# Patient Record
Sex: Female | Born: 1955 | Race: White | Hispanic: No | Marital: Married | Smoking: Never smoker
Health system: Southern US, Community
[De-identification: ages and names within clinical notes are randomized; demographics above are authoritative.]

## PROBLEM LIST (undated history)

## (undated) DIAGNOSIS — E079 Disorder of thyroid, unspecified: Secondary | ICD-10-CM

## (undated) DIAGNOSIS — M1990 Unspecified osteoarthritis, unspecified site: Secondary | ICD-10-CM

## (undated) DIAGNOSIS — K219 Gastro-esophageal reflux disease without esophagitis: Secondary | ICD-10-CM

## (undated) DIAGNOSIS — U071 COVID-19: Secondary | ICD-10-CM

## (undated) DIAGNOSIS — Z5189 Encounter for other specified aftercare: Secondary | ICD-10-CM

## (undated) DIAGNOSIS — T7840XA Allergy, unspecified, initial encounter: Secondary | ICD-10-CM

## (undated) HISTORY — PX: ABDOMINAL HYSTERECTOMY: SHX81

## (undated) HISTORY — PX: CHOLECYSTECTOMY: SHX55

## (undated) HISTORY — PX: TONSILECTOMY/ADENOIDECTOMY WITH MYRINGOTOMY: SHX6125

## (undated) HISTORY — DX: Encounter for other specified aftercare: Z51.89

## (undated) HISTORY — PX: BACK SURGERY: SHX140

## (undated) HISTORY — DX: Allergy, unspecified, initial encounter: T78.40XA

## (undated) HISTORY — PX: KNEE SURGERY: SHX244

## (undated) HISTORY — DX: Gastro-esophageal reflux disease without esophagitis: K21.9

## (undated) HISTORY — DX: COVID-19: U07.1

---

## 2011-08-18 ENCOUNTER — Emergency Department
Admission: EM | Admit: 2011-08-18 | Discharge: 2011-08-18 | Disposition: A | Discharge status: Home / Self Care | Payer: 59 | Source: Home / Self Care | Attending: Emergency Medicine | Admitting: Emergency Medicine

## 2011-08-18 DIAGNOSIS — J069 Acute upper respiratory infection, unspecified: Secondary | ICD-10-CM

## 2011-08-18 DIAGNOSIS — J029 Acute pharyngitis, unspecified: Secondary | ICD-10-CM

## 2011-08-18 LAB — POCT RAPID STREP A (OFFICE): Rapid Strep A Screen: NEGATIVE

## 2011-08-18 MED ORDER — AMOXICILLIN 875 MG PO TABS: 875.0000 mg | ORAL_TABLET | Freq: Two times a day (BID) | ORAL | Status: AC

## 2011-08-18 NOTE — ED Provider Notes (Signed)
History     CSN: [id number]  Arrival date & time 08/18/11  1200   First MD Initiated Contact with Patient 08/18/11 1206      No chief complaint on file.   (Consider location/radiation/quality/duration/timing/severity/associated sxs/prior treatment) HPI Elna is a 56 y.o. female who complains of onset of cold symptoms for 7 days.  Off and on, but worse since yesterday, especially ST.  + sore throat No cough No pleuritic pain No wheezing + nasal congestion + post-nasal drainage + sinus pain/pressure No chest congestion No itchy/red eyes No earache No hemoptysis No SOB No chills/sweats No fever No nausea No vomiting No abdominal pain No diarrhea No skin rashes No fatigue No myalgias No headache    No past medical history on file.  No past surgical history on file.  No family history on file.  History  Substance Use Topics  . Smoking status: Not on file  . Smokeless tobacco: Not on file  . Alcohol Use: Not on file    OB History    No data available      Review of Systems  All other systems reviewed and are negative.    Allergies  Sulfa antibiotics  Home Medications  No current outpatient prescriptions on file.  BP 128/79  Pulse 90  Temp(Src) 98.8 F (37.1 C) (Oral)  Resp 18  Ht 5\' 8"  (1.727 m)  Wt 198 lb (89.812 kg)  BMI 30.11 kg/m2  SpO2 97%  Physical Exam  Nursing note and vitals reviewed. Constitutional: She is oriented to person, place, and time. She appears well-developed and well-nourished.  HENT:  Head: Normocephalic and atraumatic.  Right Ear: Tympanic membrane, external ear and ear canal normal.  Left Ear: Tympanic membrane, external ear and ear canal normal.  Nose: Mucosal edema and rhinorrhea present.  Mouth/Throat: Posterior oropharyngeal erythema present. No oropharyngeal exudate or posterior oropharyngeal edema.       There is a very small area on the top of her tongue consistent with thrush. Very mild.  Eyes: No scleral  icterus.  Neck: Neck supple.  Cardiovascular: Regular rhythm and normal heart sounds.   Pulmonary/Chest: Effort normal and breath sounds normal. No respiratory distress.  Neurological: She is alert and oriented to person, place, and time.  Skin: Skin is warm and dry.  Psychiatric: She has a normal mood and affect. Her speech is normal.    ED Course  Procedures (including critical care time)  Labs Reviewed - No data to display No results found.   1. Acute upper respiratory infections of unspecified site   2. Acute pharyngitis       MDM  1)  Take the prescribed antibiotic as instructed.  I advised her for a couple days since this is likely still viral. A rapid strep test is done and is negative. A throat culture is pending. I do not feel this thrush on her tongue needs treatment however if it's getting worse the patient will call us a call in nystatin suspension for this. I feel is most likely due to her recent viral syndrome. 2)  Use nasal saline solution (over the counter) at least 3 times a day. 3)  Use over the counter decongestants like Zyrtec-D every 12 hours as needed to help with congestion.  If you have hypertension, do not take medicines with sudafed.  4)  Can take tylenol every 6 hours or motrin every 8 hours for pain or fever. 5)  Follow up with your primary doctor if no improvement  in 5-7 days, sooner if increasing pain, fever, or new symptoms.     Lily Kocher, MD 08/18/11 [phone]

## 2011-08-19 LAB — STREP A DNA PROBE: GASP: NEGATIVE

## 2011-08-20 ENCOUNTER — Telehealth: Payer: Self-pay

## 2012-11-12 DIAGNOSIS — M3500 Sicca syndrome, unspecified: Secondary | ICD-10-CM

## 2012-11-12 DIAGNOSIS — M0600 Rheumatoid arthritis without rheumatoid factor, unspecified site: Secondary | ICD-10-CM

## 2012-11-12 HISTORY — DX: Sjogren syndrome, unspecified: M35.00

## 2012-11-12 HISTORY — DX: Rheumatoid arthritis without rheumatoid factor, unspecified site: M06.00

## 2016-11-23 ENCOUNTER — Emergency Department (HOSPITAL_COMMUNITY)
Admission: EM | Admit: 2016-11-23 | Discharge: 2016-11-23 | Disposition: A | Discharge status: Home / Self Care | Payer: Managed Care, Other (non HMO) | Attending: Emergency Medicine | Admitting: Emergency Medicine

## 2016-11-23 ENCOUNTER — Emergency Department (HOSPITAL_COMMUNITY): Payer: Managed Care, Other (non HMO)

## 2016-11-23 ENCOUNTER — Encounter (HOSPITAL_COMMUNITY): Payer: Self-pay | Admitting: Emergency Medicine

## 2016-11-23 DIAGNOSIS — R1011 Right upper quadrant pain: Secondary | ICD-10-CM | POA: Insufficient documentation

## 2016-11-23 DIAGNOSIS — K2970 Gastritis, unspecified, without bleeding: Secondary | ICD-10-CM

## 2016-11-23 DIAGNOSIS — K2971 Gastritis, unspecified, with bleeding: Secondary | ICD-10-CM | POA: Diagnosis not present

## 2016-11-23 DIAGNOSIS — R110 Nausea: Secondary | ICD-10-CM | POA: Diagnosis not present

## 2016-11-23 DIAGNOSIS — R072 Precordial pain: Secondary | ICD-10-CM | POA: Diagnosis not present

## 2016-11-23 DIAGNOSIS — Z79899 Other long term (current) drug therapy: Secondary | ICD-10-CM | POA: Insufficient documentation

## 2016-11-23 DIAGNOSIS — R197 Diarrhea, unspecified: Secondary | ICD-10-CM | POA: Diagnosis not present

## 2016-11-23 HISTORY — DX: Disorder of thyroid, unspecified: E07.9

## 2016-11-23 HISTORY — DX: Unspecified osteoarthritis, unspecified site: M19.90

## 2016-11-23 LAB — BASIC METABOLIC PANEL
ANION GAP: 10 (ref 5–15)
BUN: 11 mg/dL (ref 6–20)
CO2: 25 mmol/L (ref 22–32)
Calcium: 9.8 mg/dL (ref 8.9–10.3)
Chloride: 106 mmol/L (ref 101–111)
Creatinine, Ser: 0.96 mg/dL (ref 0.44–1.00)
GFR calc Af Amer: >60 mL/min (ref >60)
GLUCOSE: 106 mg/dL — AB (ref 65–99)
POTASSIUM: 3.7 mmol/L (ref 3.5–5.1)
Sodium: 141 mmol/L (ref 135–145)

## 2016-11-23 LAB — URINALYSIS, ROUTINE W REFLEX MICROSCOPIC
Bilirubin Urine: NEGATIVE
Glucose, UA: NEGATIVE mg/dL
Hgb urine dipstick: NEGATIVE
Ketones, ur: 5 mg/dL — AB
LEUKOCYTES UA: NEGATIVE
NITRITE: NEGATIVE
PH: 6 (ref 5.0–8.0)
Protein, ur: NEGATIVE mg/dL
Specific Gravity, Urine: 1.01 (ref 1.005–1.030)

## 2016-11-23 LAB — HEPATIC FUNCTION PANEL
ALBUMIN: 4.4 g/dL (ref 3.5–5.0)
ALT: 25 U/L (ref 14–54)
AST: 24 U/L (ref 15–41)
Alkaline Phosphatase: 46 U/L (ref 38–126)
BILIRUBIN DIRECT: 0.1 mg/dL (ref 0.1–0.5)
Indirect Bilirubin: 0.5 mg/dL (ref 0.3–0.9)
Total Bilirubin: 0.6 mg/dL (ref 0.3–1.2)
Total Protein: 7.5 g/dL (ref 6.5–8.1)

## 2016-11-23 LAB — CBC
HCT: 41.7 % (ref 36.0–46.0)
HEMOGLOBIN: 14.4 g/dL (ref 12.0–15.0)
MCH: 33.2 pg (ref 26.0–34.0)
MCHC: 34.5 g/dL (ref 30.0–36.0)
MCV: 96.1 fL (ref 78.0–100.0)
Platelets: 324 10*3/uL (ref 150–400)
RBC: 4.34 MIL/uL (ref 3.87–5.11)
RDW: 12.4 % (ref 11.5–15.5)
WBC: 7 10*3/uL (ref 4.0–10.5)

## 2016-11-23 LAB — LIPASE, BLOOD: Lipase: 23 U/L (ref 11–51)

## 2016-11-23 LAB — POCT I-STAT TROPONIN I: Troponin i, poc: 0 ng/mL (ref 0.00–0.08)

## 2016-11-23 LAB — I-STAT TROPONIN, ED: Troponin i, poc: 0 ng/mL (ref 0.00–0.08)

## 2016-11-23 MED ORDER — ONDANSETRON HCL 4 MG/2ML IJ SOLN
4.0000 mg | Freq: Once | INTRAMUSCULAR | Status: AC
  Administered 2016-11-23: 4 mg via INTRAVENOUS
  Filled 2016-11-23: qty 2

## 2016-11-23 MED ORDER — ONDANSETRON 4 MG PO TBDP: 4.0000 mg | ORAL_TABLET | Freq: Three times a day (TID) | ORAL | 0 refills | Status: DC | PRN

## 2016-11-23 MED ORDER — FAMOTIDINE IN NACL 20-0.9 MG/50ML-% IV SOLN
20.0000 mg | Freq: Once | INTRAVENOUS | Status: AC
  Administered 2016-11-23: 20 mg via INTRAVENOUS
  Filled 2016-11-23: qty 50

## 2016-11-23 MED ORDER — SODIUM CHLORIDE 0.9 % IV BOLUS (SEPSIS)
1000.0000 mL | Freq: Once | INTRAVENOUS | Status: AC
  Administered 2016-11-23: 1000 mL via INTRAVENOUS

## 2016-11-23 MED ORDER — GI COCKTAIL ~~LOC~~
30.0000 mL | Freq: Once | ORAL | Status: AC
  Administered 2016-11-23: 30 mL via ORAL
  Filled 2016-11-23: qty 30

## 2016-11-23 MED ORDER — RANITIDINE HCL 150 MG PO TABS: 150.0000 mg | ORAL_TABLET | Freq: Two times a day (BID) | ORAL | 0 refills | Status: DC

## 2016-11-23 NOTE — ED Triage Notes (Signed)
Patient c/o mid upper abd pain that radiates under right rib cage and at night to back x several days. Patient reports some nausea but dines vomiting.  Patient reports yesterday pressure was intermittent but today pain is constant. Also has some loose stools, with 2-3 in past 24 hours.

## 2016-11-23 NOTE — Discharge Instructions (Signed)
Your symptoms and pain are likely from gastritis or an ulcer. You will need to take zantac as directed, and avoid spicy/fatty/acidic foods, avoid soda/coffee/tea/alcohol. Avoid laying down flat within 30 minutes of eating. Avoid NSAIDs like ibuprofen/aleve/motrin/etc on an empty stomach. May consider using over the counter tums/maalox as needed for additional relief. Use zofran as directed as needed for nausea. Use tylenol as needed for pain. Follow the BRAT diet as outlined below, in order to help with diarrhea. May consider over the counter pepto bismol or imodium as needed for diarrhea. Call your regular doctor if bloody stools, persistent diarrhea, vomiting, fever or worsening abdominal pain. Follow up with your regular doctor in one week for recheck of symptoms and ongoing management of your symptoms. Return to the ER for changes or worsening symptoms.  Abdominal (belly) pain can be caused by many things. Your caregiver performed an examination and possibly ordered blood/urine tests and imaging (CT scan, x-rays, ultrasound). Many cases can be observed and treated at home after initial evaluation in the emergency department. Even though you are being discharged home, abdominal pain can be unpredictable. Therefore, you need a repeated exam if your pain does not resolve, returns, or worsens. Most patients with abdominal pain don't have to be admitted to the hospital or have surgery, but serious problems like appendicitis and gallbladder attacks can start out as nonspecific pain. Many abdominal conditions cannot be diagnosed in one visit, so follow-up evaluations are very important. SEEK IMMEDIATE MEDICAL ATTENTION IF YOU DEVELOP ANY OF THE FOLLOWING SYMPTOMS: The pain does not go away or becomes severe.  A temperature above 101 develops.  Repeated vomiting occurs (multiple episodes).  The pain becomes localized to portions of the abdomen. The right side could possibly be appendicitis. In an adult, the left  lower portion of the abdomen could be colitis or diverticulitis.  Blood is being passed in stools or vomit (bright red or black tarry stools).  Return also if you develop chest pain, difficulty breathing, dizziness or fainting, or become confused, poorly responsive, or inconsolable (young children). The constipation stays for more than 4 days.  There is belly (abdominal) or rectal pain.  You do not seem to be getting better.

## 2016-11-23 NOTE — ED Provider Notes (Signed)
[facility] DEPT Provider Note   CSN: [id number] Arrival date & time: 11/23/16  1159     History   Chief Complaint Chief Complaint  Patient presents with  . right rib pain    right  . mid upper abd pain    HPI Vanessa Velasquez is a 61 y.o. female with a PMHx of gastritis, hypothyroidism, and RA, with a PSHx of abd hysterectomy and cholecystectomy, who presents to the ED with complaints of intermittent right upper quadrant pain 3 days. Patient states that the symptoms worsen at night especially when she lays down, however have occasionally occurred during the day (as an example, yesterday when she was cleaning her house and wiping down her stainless steel furniture, when she bent over she began to have symptoms). She describes the pain is 6/10 intermittent RUQ achy and occasionally pressure-like pain that starts in the RUQ but then radiates to her upper back and across her LUQ and into the center of her chest. Symptoms worsen with laying flat, there is no change with inspiration or exertion, and they have been minimally improved with Tums and Tylenol. One time she had the pain radiates into her right jaw and neck. The symptoms have caused her to not be able to sleep very well. Her last episode of pain started at 10:30 AM today. She has had associated chills, nausea, and 3 episodes daily of looser than normal nonbloody stool beginning yesterday. She also reports that for the last 2 months she's had allergy symptoms including postnasal drip and cough with clear sputum production. She endorses very occasional alcohol use, had one glass of alcoholic beverage on Monday this week. States her symptoms feel similar to when she had gastritis 3 years ago and was seen in the ER at [facility]. +FHx of atherosclerosis in her MGM, no other FHx of CAD/MI/cardiac disease. Pt is nonsmoker. On hormone replacement therapy (estrogen and progesterone cream, this week is her "off week").   She denies diaphoresis,  lightheadedness, fevers, chills, hemoptysis, SOB, LE swelling, recent travel/surgery/immobilization, personal/family hx of DVT/PE, vomiting, constipation, obstipation, melena, hematochezia, hematuria, dysuria, myalgias, arthralgias, claudication, orthopnea, numbness, tingling, focal weakness, or any other complaints at this time. No known hx of HLD, HTN, DM2, or any other medical conditions aside from those listed below. Denies known hx of diverticulitis.    The history is provided by the patient and medical records. No language interpreter was used.  Abdominal Pain   This is a new problem. The current episode started more than 2 days ago. The problem occurs daily. The problem has not changed since onset.The pain is associated with an unknown factor. The pain is located in the RUQ. The quality of the pain is aching and pressure-like. The pain is at a severity of 6/10. The pain is moderate. Associated symptoms include diarrhea and nausea. Pertinent negatives include fever, flatus, hematochezia, melena, vomiting, constipation, dysuria, hematuria, arthralgias and myalgias. The symptoms are aggravated by certain positions. The symptoms are relieved by antacids and acetaminophen.    Past Medical History:  Diagnosis Date  . Arthritis   . Thyroid disease     There are no active problems to display for this patient.   Past Surgical History:  Procedure Laterality Date  . ABDOMINAL HYSTERECTOMY    . CHOLECYSTECTOMY    . KNEE SURGERY      OB History    No data available       Home Medications    Prior to Admission medications  Medication Sig Start Date End Date Taking? Authorizing Provider  progesterone 200 MG SUPP at bedtime.    [provider]  thyroid (ARMOUR) 120 MG tablet Take 120 mg by mouth daily.    [provider]    Family History No family history on file.  Social History Social History  Substance Use Topics  . Smoking status: Never Smoker  . Smokeless  tobacco: Never Used  . Alcohol use Yes     Comment: occasional      Allergies   Sulfa antibiotics   Review of Systems Review of Systems  Constitutional: Positive for chills. Negative for diaphoresis and fever.  HENT: Positive for postnasal drip.   Respiratory: Positive for cough. Negative for shortness of breath.   Cardiovascular: Positive for chest pain. Negative for leg swelling.  Gastrointestinal: Positive for abdominal pain, diarrhea and nausea. Negative for blood in stool, constipation, flatus, hematochezia, melena and vomiting.  Genitourinary: Negative for dysuria and hematuria.  Musculoskeletal: Negative for arthralgias and myalgias.  Skin: Negative for color change.  Allergic/Immunologic: Negative for immunocompromised state.  Neurological: Negative for weakness, light-headedness and numbness.  Psychiatric/Behavioral: Negative for confusion.   All other systems reviewed and are negative for acute change except as noted in the HPI.    Physical Exam Updated Vital Signs BP (!) 131/93 (BP Location: Right Arm)   Pulse 87   Temp 98.2 F (36.8 C) (Oral)   Resp 17   SpO2 100%   Physical Exam  Constitutional: She is oriented to person, place, and time. Vital signs are normal. She appears well-developed and well-nourished.  Non-toxic appearance. No distress.  Afebrile, nontoxic, NAD  HENT:  Head: Normocephalic and atraumatic.  Mouth/Throat: Oropharynx is clear and moist and mucous membranes are normal.  Eyes: Conjunctivae and EOM are normal. Right eye exhibits no discharge. Left eye exhibits no discharge.  Neck: Normal range of motion. Neck supple.  Cardiovascular: Normal rate, regular rhythm, normal heart sounds and intact distal pulses.  Exam reveals no gallop and no friction rub.   No murmur heard. RRR, nl s1/s2, no m/r/g, distal pulses intact, no pedal edema   Pulmonary/Chest: Effort normal and breath sounds normal. No respiratory distress. She has no decreased breath  sounds. She has no wheezes. She has no rhonchi. She has no rales.  CTAB in all lung fields, no w/r/r, no hypoxia or increased WOB, speaking in full sentences, SpO2 100% on RA   Abdominal: Soft. Normal appearance and bowel sounds are normal. She exhibits no distension. There is tenderness in the right upper quadrant and epigastric area. There is no rigidity, no rebound, no guarding, no CVA tenderness, no tenderness at McBurney's point and negative Murphy's sign.  Soft, nondistended, +BS throughout, with very mild RUQ/epigastric TTP, no r/g/r, neg murphy's, neg mcburney's, no CVA TTP   Musculoskeletal: Normal range of motion.  MAE x4 Strength and sensation grossly intact in all extremities Distal pulses intact No pedal edema, neg homan's bilaterally   Neurological: She is alert and oriented to person, place, and time. She has normal strength. No sensory deficit.  Skin: Skin is warm, dry and intact. No rash noted.  Psychiatric: She has a normal mood and affect.  Nursing note and vitals reviewed.    ED Treatments / Results  Labs (all labs ordered are listed, but only abnormal results are displayed) Labs Reviewed  BASIC METABOLIC PANEL - Abnormal; Notable for the following:       Result Value   Glucose, Bld 106 (*)  All other components within normal limits  URINALYSIS, ROUTINE W REFLEX MICROSCOPIC - Abnormal; Notable for the following:    Ketones, ur 5 (*)    All other components within normal limits  CBC  HEPATIC FUNCTION PANEL  LIPASE, BLOOD  I-STAT TROPOININ, ED  I-STAT TROPOININ, ED  POCT I-STAT TROPONIN I    EKG  EKG Interpretation  Date/Time:  Friday November 23 2016 12:12:19 EDT Ventricular Rate:  81 PR Interval:    QRS Duration: 80 QT Interval:  349 QTC Calculation: 406 R Axis:   6 Text Interpretation:  Sinus rhythm Low voltage, precordial leads No old tracing to compare Confirmed by Malvin Johns [phone]) on 11/23/2016 12:50:41 PM       Radiology Dg Chest 2  View  Result Date: 11/23/2016 CLINICAL DATA:  Right anterior chest pain for 2 days. EXAM: CHEST  2 VIEW COMPARISON:  None. FINDINGS: The lungs are clear. Heart size is normal. No pneumothorax or pleural effusion. No bony abnormality. IMPRESSION: Negative chest. Electronically Signed   By: Inge Rise M.D.   On: 11/23/2016 13:20    Procedures Procedures (including critical care time)  Medications Ordered in ED Medications  gi cocktail (Maalox,Lidocaine,Donnatal) (30 mLs Oral Given 11/23/16 1431)  famotidine (PEPCID) IVPB 20 mg premix (0 mg Intravenous Stopped 11/23/16 1554)  ondansetron (ZOFRAN) injection 4 mg (4 mg Intravenous Given 11/23/16 1431)  sodium chloride 0.9 % bolus 1,000 mL (0 mLs Intravenous Stopped 11/23/16 1554)     Initial Impression / Assessment and Plan / ED Course  I have reviewed the triage vital signs and the nursing notes.  Pertinent labs & imaging results that were available during my care of the patient were reviewed by me and considered in my medical decision making (see chart for details).     61 y.o. female here with RUQ/epigastric pain intermittent x3 days; feels similar to prior episode of gastritis. Some looser stools in the last 24hrs as well. Mild nausea and chills. On exam, mild RUQ and epigastric TTP, nonperitoneal, no murphy's or mcburney's tenderness. No flank tenderness. No tachycardia or hypoxia, no LE swelling, neg homan's. EKG nonischemic. Trop neg 2hrs after last episode of pain. CBC WNL, BMP WNL, CXR WNL. Will add on lipase, LFTs, U/A, but doubt need for further imaging; doubt CT abd/pelv would be helpful, as this seems mostly like gastritis/gastroenteritis. Doubt PE/dissection. Will try pepcid, zofran, fluids, GI cocktail and reassess, if persisting, may consider imaging; may repeat trop at 3hr from last trop. Discussed case with my attending Dr. Stark Jock who agrees with plan.   4:27 PM LFTs WNL. Lipase WNL. U/A unremarkable. Symptoms completely resolved  with meds, which makes gastritis/GERD even more likely. Second troponin done 3hrs after initial one, which is almost 6hrs after most recent pain episode, and this was negative. Overall, symptoms likely related to gastritis/GERD. Pt with low-risk HEART score, doubt need for further emergent work up or admission. Will start on zantac daily, advised use of tums/maalox as needed. Rx for zofran given. Discussed diet and lifestyle modifications for GERD/gastritis; advised tylenol for pain. BRAT diet advised, PRN pepto/imodium as needed for additional relief. F/up with PCP in 1wk for recheck of symptoms. I explained the diagnosis and have given explicit precautions to return to the ER including for any other new or worsening symptoms. The patient understands and accepts the medical plan as it's been dictated and I have answered their questions. Discharge instructions concerning home care and prescriptions have been given. The patient is  STABLE and is discharged to home in good condition.     Final Clinical Impressions(s) / ED Diagnoses   Final diagnoses:  Colicky RUQ abdominal pain  Precordial chest pain  Nausea  Gastritis, presence of bleeding unspecified, unspecified chronicity, unspecified gastritis type  Diarrhea, unspecified type    New Prescriptions New Prescriptions   ONDANSETRON (ZOFRAN ODT) 4 MG DISINTEGRATING TABLET    Take 1 tablet (4 mg total) by mouth every 8 (eight) hours as needed for nausea or vomiting.   RANITIDINE (ZANTAC) 150 MG TABLET    Take 1 tablet (150 mg total) by mouth 2 (two) times daily.     [address] [place], [place] 11/23/16 1628    Veryl Speak, MD 11/23/16 [phone]

## 2017-03-06 ENCOUNTER — Encounter: Payer: Self-pay | Admitting: Gastroenterology

## 2017-04-19 ENCOUNTER — Ambulatory Visit (INDEPENDENT_AMBULATORY_CARE_PROVIDER_SITE_OTHER): Payer: Managed Care, Other (non HMO) | Admitting: Gastroenterology

## 2017-04-19 ENCOUNTER — Encounter: Payer: Self-pay | Admitting: Gastroenterology

## 2017-04-19 VITALS — BP 122/74 | HR 68 | Ht 69.0 in | Wt 208.0 lb

## 2017-04-19 DIAGNOSIS — Z1211 Encounter for screening for malignant neoplasm of colon: Secondary | ICD-10-CM

## 2017-04-19 DIAGNOSIS — K219 Gastro-esophageal reflux disease without esophagitis: Secondary | ICD-10-CM

## 2017-04-19 MED ORDER — NA SULFATE-K SULFATE-MG SULF 17.5-3.13-1.6 GM/177ML PO SOLN: ORAL | 0 refills | Status: DC

## 2017-04-19 NOTE — Progress Notes (Signed)
HPI: This is a very pleasant 61 year old woman who was referred to me by Kerney Elbe, MD  to evaluate colon cancer screening, dyspepsia.    Chief complaint is routine risk for colon cancer, dyspepsia, indigestion  Colonoscopy 10 years ago in [place]; normal.  Also one 10 years prior to that and it was normal as well.  seroneg rhem arhtritis after joint pains Digesting issues when on prednisone chest pains and indigestion. This was 4-5 years ago.  Also pain in back of her neck with the indigestion.  She thinks she possibly was on NSAIDs around that same time  Dysphagia this past summer. Flu in the spring, coughing a lot.  Was put on zantac by ER after CP issue.  This helped the dysphagia issue very well. Still has pains in her neck.  Needs to take it bid.  Took alleve pretty regularly, then switched to tylenol.  When joint pains will take a single advil.  Overall her weight is stable.  If she skips zantac, she will have pyrosis, pains in neck in back.   Old Data Reviewed: Blood work July 2018 show normal complete metabolic profile, normal CBC.    Review of systems: Pertinent positive and negative review of systems were noted in the above HPI section. All other review negative.   Past Medical History:  Diagnosis Date  . Arthritis   . Thyroid disease     Past Surgical History:  Procedure Laterality Date  . ABDOMINAL HYSTERECTOMY    . CHOLECYSTECTOMY    . KNEE SURGERY    . TONSILECTOMY/ADENOIDECTOMY WITH MYRINGOTOMY      Current Outpatient Prescriptions  Medication Sig Dispense Refill  . acetaminophen (TYLENOL) 500 MG tablet Take 500-1,000 mg by mouth every 6 (six) hours as needed (For pain.).    Marland Kitchen Ascorbic Acid (VITAMIN C) 1000 MG tablet Take 1,000 mg by mouth daily.    . Carboxymethylcell-Hypromellose (GENTEAL OP) Place 2 drops into both eyes as needed (For dry eyes.).    Marland Kitchen Cholecalciferol (VITAMIN D3) 5000 units TABS Take 5,000 Units by mouth daily.     . Multiple  Vitamin (MULTIVITAMIN WITH MINERALS) TABS tablet Take 1 tablet by mouth daily.    Marland Kitchen omega-3 acid ethyl esters (LOVAZA) 1 g capsule Take 2 g by mouth 2 (two) times daily.    Marland Kitchen PRESCRIPTION MEDICATION Apply 100 mg topically See admin instructions. Progesterone 200mg /ml 20% Cream - applies at bedtime 3 weeks out of the month    . PRESCRIPTION MEDICATION Apply 1 mg topically See admin instructions. Testosterone 2mg /ml Cream - applies daily 3 weeks out of the month    . PRESCRIPTION MEDICATION Apply 0.75 mLs topically See admin instructions. Bi-Est 50:50 5mg /ml Cream - applies twice daily for 3 weeks out of the month    . ranitidine (ZANTAC) 150 MG tablet Take 1 tablet (150 mg total) by mouth 2 (two) times daily. 60 tablet 0  . thyroid (NP THYROID) 120 MG tablet Take 120 mg by mouth daily before breakfast.     No current facility-administered medications for this visit.     Allergies as of 04/19/2017 - Review Complete 04/19/2017  Allergen Reaction Noted  . Sulfa antibiotics Swelling and Rash 08/18/2011    Family History  Problem Relation Age of Onset  . Thyroid disease Mother   . Breast cancer Sister   . Diabetes Sister     Social History   Social History  . Marital status: Married    Spouse name: N/A  .  Number of children: 4  . Years of education: N/A   Occupational History  . Not on file.   Social History Main Topics  . Smoking status: Never Smoker  . Smokeless tobacco: Never Used  . Alcohol use Yes     Comment: occasional   . Drug use: No  . Sexual activity: Not on file   Other Topics Concern  . Not on file   Social History Narrative  . No narrative on file     Physical Exam: BP 122/74   Pulse 68   Ht 5\' 9"  (1.753 m)   Wt 208 lb (94.3 kg)   BMI 30.72 kg/m  Constitutional: generally well-appearing Psychiatric: alert and oriented x3 Eyes: extraocular movements intact Mouth: oral pharynx moist, no lesions Neck: supple no lymphadenopathy Cardiovascular: heart  regular rate and rhythm Lungs: clear to auscultation bilaterally Abdomen: soft, nontender, nondistended, no obvious ascites, no peritoneal signs, normal bowel sounds Extremities: no lower extremity edema bilaterally Skin: no lesions on visible extremities   Assessment and plan: 61 y.o. female with routine risk for colon cancer, chronic GERD, dyspepsia  First she is at routine risk for colon cancer and her last screening was 10 years ago by colonoscopy.  We will plan for repeat screening colonoscopy at her soonest convenience.  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  Ranitidine 150 mg twice daily seems to work very well for her but when she skips the medicine she has some pyrosis as well as some posterior high neck discomfort.  I explained that the neck discomfort is an unusual symptom for GERD but since it is so reliable that it occurs when she has pyrosis and when she skips [id number], it certainly may be a GERD symptom.  She has had dysphasia in the past.  I recommended she stay on her twice daily H2 blockers and at the same time as her colonoscopy we will plan on upper endoscopy for her as well to check for significant GERD damage, Barrett's esophagus.    Please see the "Patient Instructions" section for addition details about the plan.   Owens Loffler, MD [facility] 04/19/2017, 8:42 AM  Cc: Kerney Elbe, MD

## 2017-04-19 NOTE — Addendum Note (Signed)
Addended by: Candie Mile on: 04/19/2017 09:53 AM   Modules accepted: Orders

## 2017-04-19 NOTE — Patient Instructions (Addendum)
You will be set up for a colonoscopy for screening. You will be set up for an upper endoscopy at the same time for dypsepsia, GERD.  You have been scheduled for an endoscopy and colonoscopy. Please follow the written instructions given to you at your visit today. Please pick up your prep supplies at the pharmacy within the next 1-3 days. If you use inhalers (even only as needed), please bring them with you on the day of your procedure. Your physician has requested that you go to www.startemmi.com and enter the access code given to you at your visit today. This web site gives a general overview about your procedure. However, you should still follow specific instructions given to you by our office regarding your preparation for the procedure.  Stay on ranitidine 150 twice daily. Change the evening pill so it is at bedtime.

## 2017-05-06 ENCOUNTER — Encounter: Payer: Self-pay | Admitting: Gastroenterology

## 2017-05-10 ENCOUNTER — Ambulatory Visit (AMBULATORY_SURGERY_CENTER): Payer: Managed Care, Other (non HMO) | Admitting: Gastroenterology

## 2017-05-10 ENCOUNTER — Encounter: Payer: Self-pay | Admitting: Gastroenterology

## 2017-05-10 ENCOUNTER — Encounter: Payer: 59 | Admitting: Gastroenterology

## 2017-05-10 VITALS — BP 106/58 | HR 58 | Temp 98.0°F | Resp 15 | Ht 69.0 in | Wt 208.0 lb

## 2017-05-10 DIAGNOSIS — Z1211 Encounter for screening for malignant neoplasm of colon: Secondary | ICD-10-CM

## 2017-05-10 DIAGNOSIS — K219 Gastro-esophageal reflux disease without esophagitis: Secondary | ICD-10-CM

## 2017-05-10 DIAGNOSIS — K209 Esophagitis, unspecified without bleeding: Secondary | ICD-10-CM

## 2017-05-10 DIAGNOSIS — K5730 Diverticulosis of large intestine without perforation or abscess without bleeding: Secondary | ICD-10-CM

## 2017-05-10 DIAGNOSIS — D124 Benign neoplasm of descending colon: Secondary | ICD-10-CM | POA: Diagnosis not present

## 2017-05-10 MED ORDER — SODIUM CHLORIDE 0.9 % IV SOLN: 500.0000 mL | INTRAVENOUS | Status: DC

## 2017-05-10 MED ORDER — OMEPRAZOLE 40 MG PO CPDR: 40.0000 mg | DELAYED_RELEASE_CAPSULE | Freq: Every day | ORAL | 11 refills | Status: DC

## 2017-05-10 NOTE — Patient Instructions (Signed)
Impression/Recommendations:  GERD handout given to patient. Hiatal hernia handout given to patient. Polyp handout given to patient. Diverticulosis handout given to patient.  Resume previous diet. Stop Zantac, and being Omeprazole 40 mg. Pill each morning shortly before breakfast daily.  Call office in 2-3 months to reports on symptoms.  Repeat colonoscopy recommended based on pathology results.  YOU HAD AN ENDOSCOPIC PROCEDURE TODAY AT [facility]:   Refer to the procedure report that was given to you for any specific questions about what was found during the examination.  If the procedure report does not answer your questions, please call your gastroenterologist to clarify.  If you requested that your care partner not be given the details of your procedure findings, then the procedure report has been included in a sealed envelope for you to review at your convenience later.  YOU SHOULD EXPECT: Some feelings of bloating in the abdomen. Passage of more gas than usual.  Walking can help get rid of the air that was put into your GI tract during the procedure and reduce the bloating. If you had a lower endoscopy (such as a colonoscopy or flexible sigmoidoscopy) you may notice spotting of blood in your stool or on the toilet paper. If you underwent a bowel prep for your procedure, you may not have a normal bowel movement for a few days.  Please Note:  You might notice some irritation and congestion in your nose or some drainage.  This is from the oxygen used during your procedure.  There is no need for concern and it should clear up in a day or so.  SYMPTOMS TO REPORT IMMEDIATELY:   Following lower endoscopy (colonoscopy or flexible sigmoidoscopy):  Excessive amounts of blood in the stool  Significant tenderness or worsening of abdominal pains  Swelling of the abdomen that is new, acute  Fever of 100F or higher   Following upper endoscopy (EGD)  Vomiting of blood or coffee  ground material  New chest pain or pain under the shoulder blades  Painful or persistently difficult swallowing  New shortness of breath  Fever of 100F or higher  Black, tarry-looking stools  For urgent or emergent issues, a gastroenterologist can be reached at any hour by calling [phone].   DIET:  We do recommend a small meal at first, but then you may proceed to your regular diet.  Drink plenty of fluids but you should avoid alcoholic beverages for 24 hours.  ACTIVITY:  You should plan to take it easy for the rest of today and you should NOT DRIVE or use heavy machinery until tomorrow (because of the sedation medicines used during the test).    FOLLOW UP: Our staff will call the number listed on your records the next business day following your procedure to check on you and address any questions or concerns that you may have regarding the information given to you following your procedure. If we do not reach you, we will leave a message.  However, if you are feeling well and you are not experiencing any problems, there is no need to return our call.  We will assume that you have returned to your regular daily activities without incident.  If any biopsies were taken you will be contacted by phone or by letter within the next 1-3 weeks.  Please call us at [phone] if you have not heard about the biopsies in 3 weeks.    SIGNATURES/CONFIDENTIALITY: You and/or your care partner have signed paperwork  which will be entered into your electronic medical record.  These signatures attest to the fact that that the information above on your After Visit Summary has been reviewed and is understood.  Full responsibility of the confidentiality of this discharge information lies with you and/or your care-partner.

## 2017-05-10 NOTE — Op Note (Signed)
[facility] Patient Name: Vanessa Velasquez Procedure Date: 05/10/2017 8:05 AM MRN: [id number] Endoscopist: Milus Banister , MD Age: 61 Referring MD:  Date of Birth: 02-Jul-1955 Gender: Female Account #: [id number] Procedure:                Colonoscopy Indications:              Screening for colorectal malignant neoplasm;                            colonoscopy 10 years ago (elsewhere) was normal Medicines:                Monitored Anesthesia Care Procedure:                Pre-Anesthesia Assessment:                           - Prior to the procedure, a History and Physical                            was performed, and patient medications and                            allergies were reviewed. The patient's tolerance of                            previous anesthesia was also reviewed. The risks                            and benefits of the procedure and the sedation                            options and risks were discussed with the patient.                            All questions were answered, and informed consent                            was obtained. Prior Anticoagulants: The patient has                            taken no previous anticoagulant or antiplatelet                            agents. ASA Grade Assessment: II - A patient with                            mild systemic disease. After reviewing the risks                            and benefits, the patient was deemed in                            satisfactory condition to undergo the procedure.  After obtaining informed consent, the colonoscope                            was passed under direct vision. Throughout the                            procedure, the patient's blood pressure, pulse, and                            oxygen saturations were monitored continuously. The                            Colonoscope was introduced through the anus and                            advanced to the  the cecum, identified by                            appendiceal orifice and ileocecal valve. The                            colonoscopy was performed without difficulty. The                            patient tolerated the procedure well. The quality                            of the bowel preparation was good. The ileocecal                            valve, appendiceal orifice, and rectum were                            photographed. Scope In: 8:17:41 AM Scope Out: 8:33:44 AM Scope Withdrawal Time: 0 hours 11 minutes 32 seconds  Total Procedure Duration: 0 hours 16 minutes 3 seconds  Findings:                 A 8 mm polyp was found in the descending colon. The                            polyp was sessile. The polyp was removed with a                            cold snare. Resection and retrieval were complete.                           Multiple small and large-mouthed diverticula were                            found in the left colon.                           The exam was otherwise without abnormality on  direct and retroflexion views. Complications:            No immediate complications. Estimated blood loss:                            None. Estimated Blood Loss:     Estimated blood loss: none. Impression:               - One 8 mm polyp in the descending colon, removed                            with a cold snare. Resected and retrieved.                           - Diverticulosis in the left colon.                           - The examination was otherwise normal on direct                            and retroflexion views. Recommendation:           - Patient has a contact number available for                            emergencies. The signs and symptoms of potential                            delayed complications were discussed with the                            patient. Return to normal activities tomorrow.                            Written discharge  instructions were provided to the                            patient.                           - Resume previous diet.                           - Continue present medications.                           You will receive a letter within 2-3 weeks with the                            pathology results and my final recommendations.                           If the polyp(s) is proven to be 'pre-cancerous' on                            pathology, you will need repeat colonoscopy in 5  years. If the polyp(s) is NOT 'precancerous' on                            pathology then you should repeat colon cancer                            screening in 10 years with colonoscopy without need                            for colon cancer screening by any method prior to                            then (including stool testing). Milus Banister, MD 05/10/2017 8:35:55 AM This report has been signed electronically.

## 2017-05-10 NOTE — Progress Notes (Signed)
To PACU, VSS. Report to [email or web address] 

## 2017-05-10 NOTE — Progress Notes (Signed)
Called to room to assist during endoscopic procedure.  Patient ID and intended procedure confirmed with present staff. Received instructions for my participation in the procedure from the performing physician.  

## 2017-05-10 NOTE — Op Note (Signed)
[facility] Patient Name: Vanessa Velasquez Procedure Date: 05/10/2017 8:05 AM MRN: [id number] Endoscopist: Milus Banister , MD Age: 61 Referring MD:  Date of Birth: 1956-01-21 Gender: Female Account #: [id number] Procedure:                Upper GI endoscopy Indications:              Dysphagia, Heartburn Medicines:                Monitored Anesthesia Care Procedure:                Pre-Anesthesia Assessment:                           - Prior to the procedure, a History and Physical                            was performed, and patient medications and                            allergies were reviewed. The patient's tolerance of                            previous anesthesia was also reviewed. The risks                            and benefits of the procedure and the sedation                            options and risks were discussed with the patient.                            All questions were answered, and informed consent                            was obtained. Prior Anticoagulants: The patient has                            taken no previous anticoagulant or antiplatelet                            agents. ASA Grade Assessment: II - A patient with                            mild systemic disease. After reviewing the risks                            and benefits, the patient was deemed in                            satisfactory condition to undergo the procedure.                           After obtaining informed consent, the endoscope was  passed under direct vision. Throughout the                            procedure, the patient's blood pressure, pulse, and                            oxygen saturations were monitored continuously. The                            Endoscope was introduced through the mouth, and                            advanced to the second part of duodenum. The upper                            GI endoscopy was accomplished  without difficulty.                            The patient tolerated the procedure well. Scope In: Scope Out: Findings:                 LA Grade C (one or more mucosal breaks continuous                            between tops of 2 or more mucosal folds, less than                            75% circumference) esophagitis was found in the                            lower third of the esophagus. There was a minor                            associated peptic stricture that I did not dilate                            given the ulcerative esophagitis and lack of                            dysphagia while on anti acid medicine.                           A small hiatal hernia was present.                           The examined duodenum was normal. Complications:            No immediate complications. Estimated blood loss:                            None. Estimated Blood Loss:     Estimated blood loss: none. Impression:               - Moderate, reflux related, ulcerative esophagitis  and focal peptic stricture.                           - Small hiatal hernia.                           - Normal examined duodenum.                           - No specimens collected. Recommendation:           - Patient has a contact number available for                            emergencies. The signs and symptoms of potential                            delayed complications were discussed with the                            patient. Return to normal activities tomorrow.                            Written discharge instructions were provided to the                            patient.                           - Resume previous diet.                           - Stop zantac and instead start omeprazole 40mg                             pill, one pill every morning shortly before                            breakfast meal daily, disp 30 with 11 refils. Call                            my office  in 2-3 months to report on your symptoms. Milus Banister, MD 05/10/2017 8:44:37 AM This report has been signed electronically.

## 2017-05-13 ENCOUNTER — Telehealth: Payer: Self-pay | Admitting: *Deleted

## 2017-05-13 NOTE — Telephone Encounter (Signed)
No answer, message left for the patient. 

## 2017-05-15 ENCOUNTER — Encounter: Payer: Self-pay | Admitting: Gastroenterology

## 2018-05-07 IMAGING — CR DG CHEST 2V
2 series · 2 of 2 positions shown · non-contrast
Comparison: None.

CLINICAL DATA: Right anterior chest pain for 2 days.

EXAM:
CHEST  2 VIEW

[w chest pa]
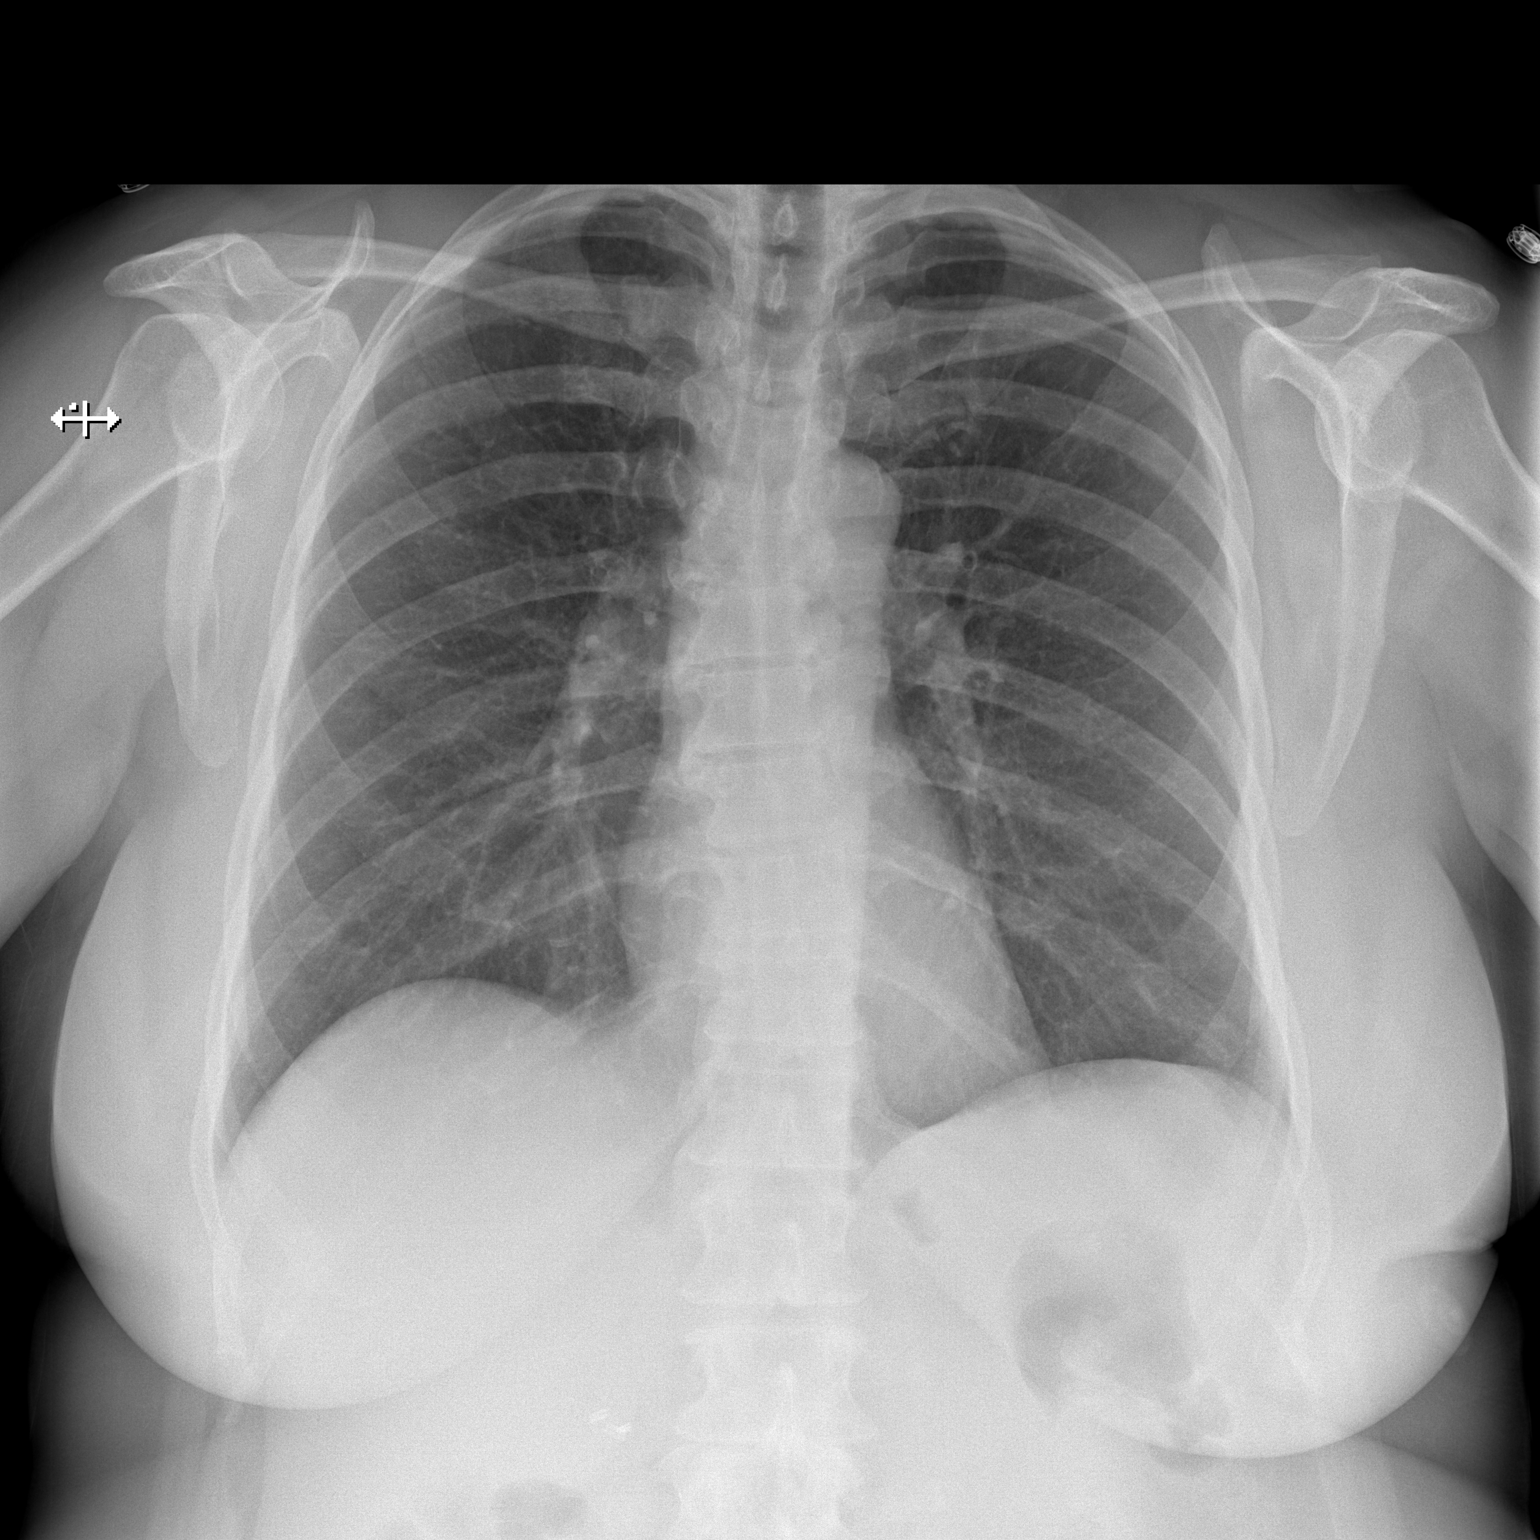

[w chest lat]
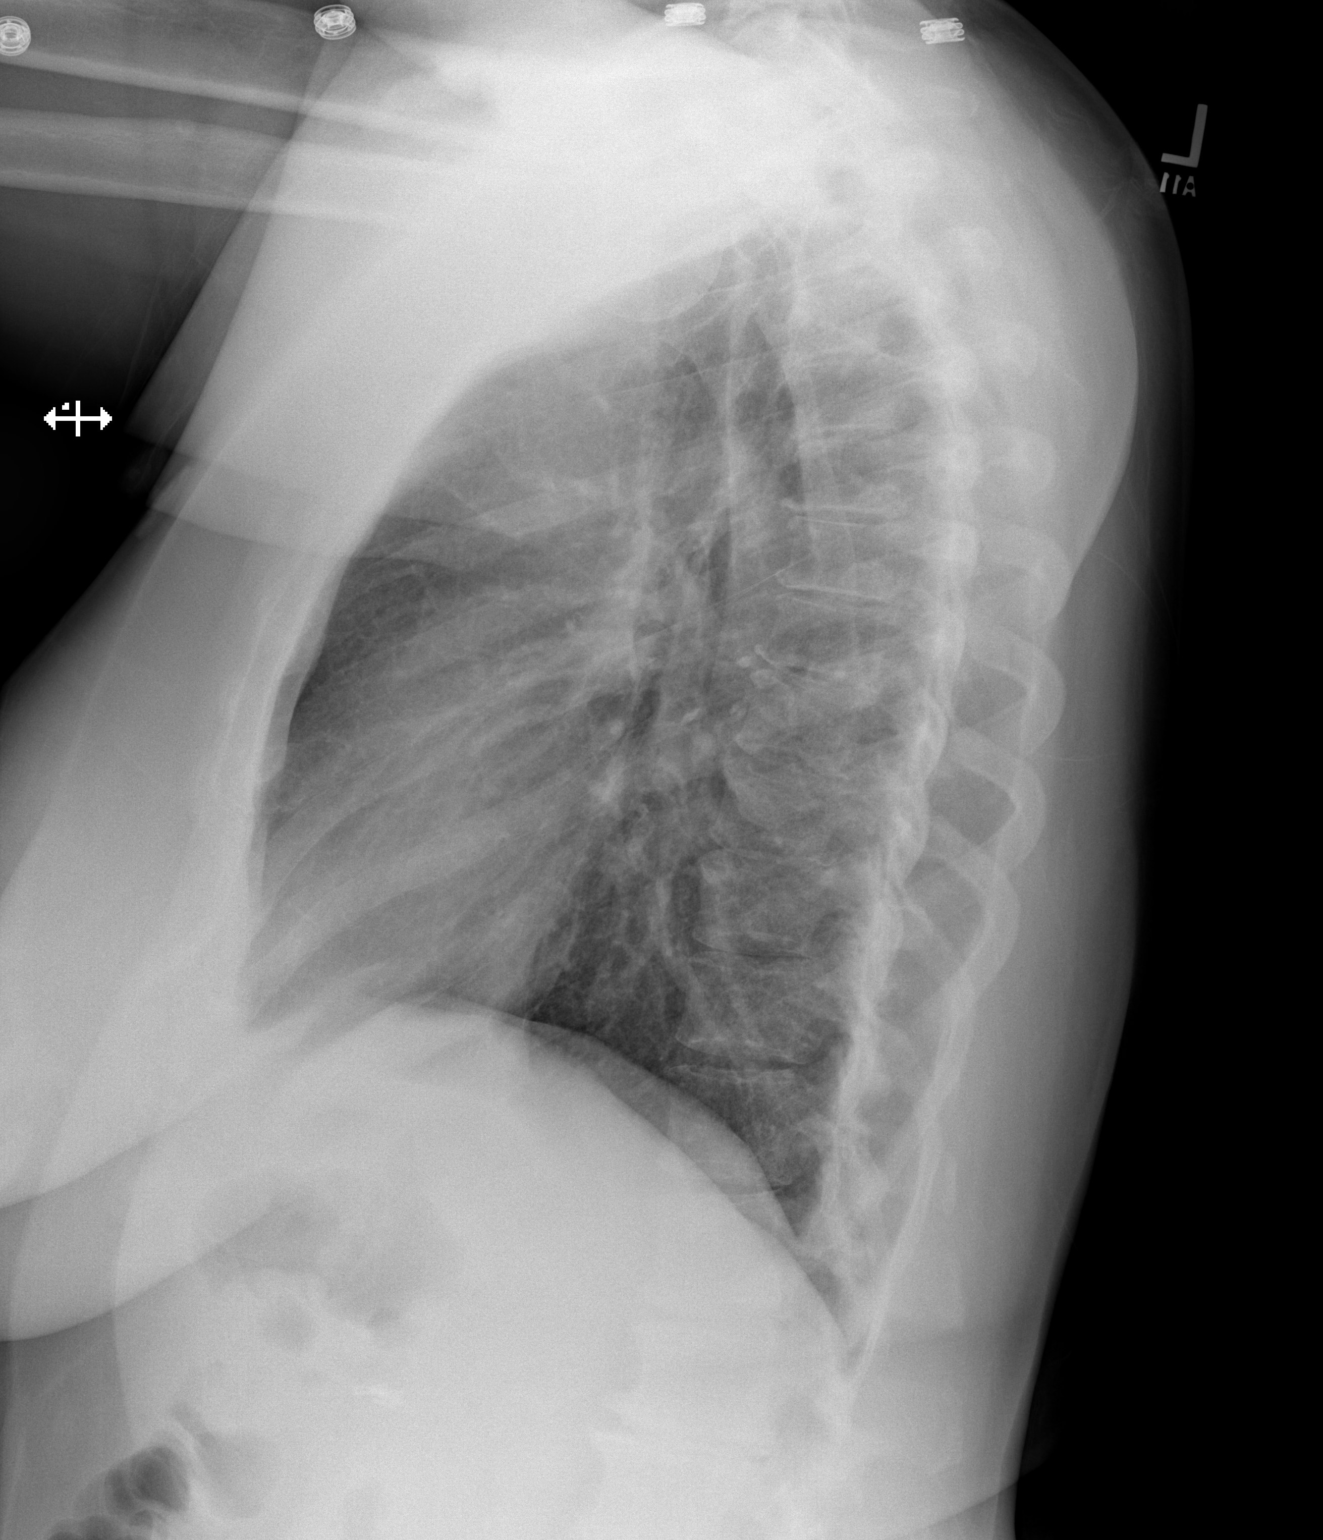

[2 of 2 positions shown; findings below may reference images not displayed]

FINDINGS: The lungs are clear. Heart size is normal. No pneumothorax or
pleural effusion. No bony abnormality.
IMPRESSION: Negative chest.

## 2018-05-22 ENCOUNTER — Other Ambulatory Visit: Payer: Self-pay | Admitting: Gastroenterology

## 2019-05-20 DIAGNOSIS — M48062 Spinal stenosis, lumbar region with neurogenic claudication: Secondary | ICD-10-CM

## 2019-05-20 DIAGNOSIS — M4316 Spondylolisthesis, lumbar region: Secondary | ICD-10-CM

## 2019-05-20 HISTORY — DX: Spondylolisthesis, lumbar region: M43.16

## 2019-05-20 HISTORY — DX: Spinal stenosis, lumbar region with neurogenic claudication: M48.062

## 2019-09-23 DIAGNOSIS — R1011 Right upper quadrant pain: Secondary | ICD-10-CM

## 2019-09-23 DIAGNOSIS — R1013 Epigastric pain: Secondary | ICD-10-CM

## 2019-09-23 HISTORY — DX: Right upper quadrant pain: R10.11

## 2019-09-23 HISTORY — DX: Epigastric pain: R10.13

## 2019-10-02 ENCOUNTER — Other Ambulatory Visit: Payer: Self-pay

## 2019-10-02 ENCOUNTER — Encounter: Payer: Self-pay | Admitting: Gastroenterology

## 2019-10-02 ENCOUNTER — Ambulatory Visit (INDEPENDENT_AMBULATORY_CARE_PROVIDER_SITE_OTHER): Payer: BC Managed Care – PPO | Admitting: Gastroenterology

## 2019-10-02 DIAGNOSIS — R109 Unspecified abdominal pain: Secondary | ICD-10-CM

## 2019-10-02 DIAGNOSIS — R140 Abdominal distension (gaseous): Secondary | ICD-10-CM

## 2019-10-02 NOTE — Progress Notes (Signed)
Review of pertinent gastrointestinal problems: 1.  GERD, dysphagia.  EGD November 2018 Dr. Ardis Hughs found severe, LA grade C, reflux related esophagitis and a small hiatal hernia.  I changed her from H2 blocker to omeprazole 40 mg once daily.  She was to call back in 2 to 3 months to report her symptoms.  We have not heard from her since 2.  Colonoscopy, November 2018 for routine risk screening found 8 mm tubular adenoma.  Diverticulosis of the left colon.  HPI: This is a very pleasant 64 year old woman whom I last saw about 2-1/2 years ago at the time of colonoscopy and upper endoscopy.  See those results summarized above  Blood work January 2021 showed normal hemoglobin however MCV was 101, normal complete metabolic profile  For about a year she has had intermittent right upper quadrant, right flank pain.  At times it would hurt to lay on her right side.  She came down with Covid May 2020 and had significant diarrhea and respiratory symptoms.  Ever since then her stools have remained a bit on the loose side.  She underwent urologic testing for kidney stones with a CT scan July 2020, noncontrast stone study.  This is for the [facility].  No obstructing stones were noted.  She is mainly bothered now by abdominal pressure and bloating.  She has gained 20 pounds in the past year.  The bloating and pressure seems to bother her quite a lot.  She has bile taste in her mouth at times.  She lost her sense of taste and smell with Covid and did not really fully gained then back yet.  She has been up and down on her proton pump inhibitor trying to see if it was playing a role in some of her symptoms, currently she is taking Prilosec only every 2 or 3 days.  She does not take NSAIDs.  Her mother was recently diagnosed with either pancreatic or gastric cancer  ROS: complete GI ROS as described in HPI, all other review negative.  Constitutional:  No unintentional weight loss   Past Medical History:   Diagnosis Date  . Abdominal pain, epigastric 09/23/2019  . Allergy   . Arthritis   . Blood transfusion without reported diagnosis    1982 during childbirth  . GERD (gastroesophageal reflux disease)   . 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 RUQ pain 09/23/2019  . Seronegative rheumatoid arthritis ([place]) 11/12/2012  . Sjogren's syndrome ([place]) 11/12/2012  . Spondylolisthesis at L4-L5 level 05/20/2019  . Thyroid disease     Past Surgical History:  Procedure Laterality Date  . ABDOMINAL HYSTERECTOMY    . BACK SURGERY    . CHOLECYSTECTOMY    . KNEE SURGERY    . TONSILECTOMY/ADENOIDECTOMY WITH MYRINGOTOMY      Current Outpatient Medications  Medication Sig Dispense Refill  . acetaminophen (TYLENOL) 500 MG tablet Take 500-1,000 mg by mouth every 6 (six) hours as needed (For pain.).    Marland Kitchen Ascorbic Acid (VITAMIN C) 1000 MG tablet Take 1,000 mg by mouth daily.    . Carboxymethylcell-Hypromellose (GENTEAL OP) Place 2 drops into both eyes as needed (For dry eyes.).    Marland Kitchen Cholecalciferol (VITAMIN D3) 5000 units TABS Take 5,000 Units by mouth daily.     Marland Kitchen gabapentin (NEURONTIN) 300 MG capsule Take 300 mg by mouth 3 (three) times daily.    Marland Kitchen omega-3 acid ethyl esters (LOVAZA) 1 g capsule Take 2 g by mouth 2 (two) times daily.    Marland Kitchen  omeprazole (PRILOSEC) 40 MG capsule TAKE 1 CAPSULE (40 MG TOTAL) DAILY BY MOUTH. 90 capsule 3  . PRESCRIPTION MEDICATION Apply 100 mg topically See admin instructions. Progesterone 200mg /ml 20% Cream - applies at bedtime 3 weeks out of the month    . PRESCRIPTION MEDICATION Apply 1 mg topically See admin instructions. Testosterone 2mg /ml Cream - applies daily 3 weeks out of the month    . PRESCRIPTION MEDICATION Apply 0.75 mLs topically See admin instructions. Bi-Est 50:50 5mg /ml Cream - applies twice daily for 3 weeks out of the month    . thyroid (NP THYROID) 120 MG tablet Take 120 mg by mouth daily before breakfast.     No current  facility-administered medications for this visit.    Allergies as of 10/02/2019 - Review Complete 10/02/2019  Allergen Reaction Noted  . Sulfa antibiotics Swelling and Rash 08/18/2011    Family History  Problem Relation Age of Onset  . Thyroid disease Mother   . Cervical cancer Mother   . Breast cancer Sister   . Diabetes Sister     Social History   Socioeconomic History  . Marital status: Married    Spouse name: Not on file  . Number of children: 4  . Years of education: Not on file  . Highest education level: Not on file  Occupational History  . Not on file  Tobacco Use  . Smoking status: Never Smoker  . Smokeless tobacco: Never Used  Substance and Sexual Activity  . Alcohol use: Yes    Comment: occasional   . Drug use: No  . Sexual activity: Not on file  Other Topics Concern  . Not on file  Social History Narrative  . Not on file   Social Determinants of Health   Financial Resource Strain:   . Difficulty of Paying Living Expenses:   Food Insecurity:   . Worried About Charity fundraiser in the Last Year:   . Arboriculturist in the Last Year:   Transportation Needs:   . Film/video editor (Medical):   Marland Kitchen Lack of Transportation (Non-Medical):   Physical Activity:   . Days of Exercise per Week:   . Minutes of Exercise per Session:   Stress:   . Feeling of Stress :   Social Connections:   . Frequency of Communication with Friends and Family:   . Frequency of Social Gatherings with Friends and Family:   . Attends Religious Services:   . Active Member of Clubs or Organizations:   . Attends Archivist Meetings:   Marland Kitchen Marital Status:   Intimate Partner Violence:   . Fear of Current or Ex-Partner:   . Emotionally Abused:   Marland Kitchen Physically Abused:   . Sexually Abused:      Physical Exam: BP 124/70   Pulse 73   Temp 98.7 F (37.1 C)   Ht 5\' 9"  (1.753 m)   Wt 230 lb (104.3 kg)   SpO2 98%   BMI 33.97 kg/m  Constitutional: generally  well-appearing Psychiatric: alert and oriented x3 Abdomen: soft, very mild abdominal tenderness throughout, nondistended, no obvious ascites, no peritoneal signs, normal bowel sounds No peripheral edema noted in lower extremities  Assessment and plan: 64 y.o. female with bloating, abdominal pressure, intermittent right flank pains.  Perhaps this is prolonged GI effects of her Covid illness last year.  20 pound weight gain over the past year can also contribute to the the symptoms.  She is not taking her proton pump  inhibitor on a daily basis and she had pretty significant acid related damage and so I recommend she get back on that omeprazole 40 mg once daily on a daily basis instead of as needed.  Certainly GERD can cause dyspeptic issues possibly that may be playing a role here as well.  I also recommended she try taking Gas-X 1 pill with every meal for her bloating which is a prominent symptom of hers.  I recommended CT scan abdomen pelvis IV and oral contrast to make sure we are not missing anything serious, her mother had either gastric or pancreatic cancer recently and that is obviously on her mind.  If she does not feel better with the empiric changes to her medicines above and if the CAT scan does not show an obvious cause then she understands she might need further testing.  Please see the "Patient Instructions" section for addition details about the plan.  Owens Loffler, MD [facility] 10/02/2019, 3:49 PM   Total time on date of encounter was 30 minutes (this included time spent preparing to see the patient reviewing records; obtaining and/or reviewing separately obtained history; performing a medically appropriate exam and/or evaluation; counseling and educating the patient and family if present; ordering medications, tests or procedures if applicable; and documenting clinical information in the health record).

## 2019-10-02 NOTE — Patient Instructions (Signed)
If you are age 62 or older, your body mass index should be between 23-30. Your Body mass index is 33.97 kg/m. If this is out of the aforementioned range listed, please consider follow up with your Primary Care Provider.  If you are age 64 or younger, your body mass index should be between 19-25. Your Body mass index is 33.97 kg/m. If this is out of the aformentioned range listed, please consider follow up with your Primary Care Provider.   You have been scheduled for a CT scan of the abdomen and pelvis at [facility]   You are scheduled on 10/09/19 at 3:30pm. You should arrive 15 minutes prior to your appointment time for registration. Please follow the written instructions below on the day of your exam:   1) Do not eat or drink anything after 11:30am (4 hours prior to your test) 2) You have been given 2 bottles of oral contrast to drink. The solution may taste better if refrigerated, but do NOT add ice or any other liquid to this solution. Shake well before drinking.    Drink 1 bottle of contrast @ 1:30pm (2 hours prior to your exam)  Drink 1 bottle of contrast @ 2:30pm (1 hour prior to your exam)  You may take any medications as prescribed with a small amount of water, if necessary. If you take any of the following medications: METFORMIN, GLUCOPHAGE, GLUCOVANCE, AVANDAMET, RIOMET, FORTAMET, ACTOPLUS MET, JANUMET, GLUMETZA or METAGLIP, you MAY be asked to HOLD this medication 48 hours AFTER the exam.  The purpose of you drinking the oral contrast is to aid in the visualization of your intestinal tract. The contrast solution may cause some diarrhea. Depending on your individual set of symptoms, you may also receive an intravenous injection of x-ray contrast/dye. Pmed.  This test typically takes 30-45 minutes to complete.  If you have any questions regarding your exam or if you need to reschedule, you may call the Radiology department at [phone] between the hours of  8:00 am and 5:00 pm, Monday-Friday.  ______________________________________________________   START: Gas-X take one pill with every meal.  START: Omeprazole 40mg take one capsule shortly before breakfast meal.   Thank you for entrusting me with your care and choosing [facility].  Dr Jacobs  

## 2019-10-06 ENCOUNTER — Telehealth: Payer: Self-pay | Admitting: Gastroenterology

## 2019-10-06 DIAGNOSIS — R109 Unspecified abdominal pain: Secondary | ICD-10-CM

## 2019-10-06 DIAGNOSIS — R140 Abdominal distension (gaseous): Secondary | ICD-10-CM

## 2019-10-06 NOTE — Telephone Encounter (Signed)
Spoke with patient and she is not able to come to the lab prior to appointment to have lab work completed. She was advised to report to Radiology Department on 10-09-19 at 2:45pm to have stat BMET drawn.  Patient agreed to plan and verbalized understanding. No further questions.

## 2019-10-06 NOTE — Telephone Encounter (Signed)
[facility] is calling because there are no recent labs for this patient. They want to know if she should have an I-STAT done Creatinine

## 2019-10-07 ENCOUNTER — Telehealth: Payer: Self-pay | Admitting: Gastroenterology

## 2019-10-07 NOTE — Addendum Note (Signed)
Addended by: Stevan Born on: 10/07/2019 01:29 PM   Modules accepted: Orders

## 2019-10-07 NOTE — Telephone Encounter (Signed)
Pt requested to have CT order sent to [place].  Please fax order to [phone].

## 2019-10-07 NOTE — Telephone Encounter (Signed)
Tedra Coupe notified that patient has decided to have CT at [facility], and there isn't any need for lab orders for [facility].  Tedra Coupe verbalized understanding.

## 2019-10-07 NOTE — Telephone Encounter (Signed)
Spoke with patient and she would prefer to have CT done at [facility].  Orders faxed to number provided. [facility] will contact patient to schedule.

## 2019-10-07 NOTE — Telephone Encounter (Signed)
Vanda from [facility] is calling- she states that instead of the BMET drawn requesting for it to be put in as I-STAT Creatinine. She states to call her with any questions.

## 2019-10-07 NOTE — Addendum Note (Signed)
Addended by: Stevan Born on: 10/07/2019 01:33 PM   Modules accepted: Orders

## 2019-10-09 ENCOUNTER — Ambulatory Visit (HOSPITAL_COMMUNITY): Admission: RE | Admit: 2019-10-09 | Payer: BC Managed Care – PPO | Source: Ambulatory Visit

## 2019-10-12 ENCOUNTER — Telehealth: Payer: Self-pay | Admitting: Gastroenterology

## 2019-10-12 NOTE — Telephone Encounter (Signed)
CT scan abdomen pelvis with IV and oral contrast done at [facility] October 08, 2019 results were reviewed   Findings "no acute abnormalities in the abdomen or pelvis.  Hepatic steatosis."  Spinal stenosis L1-L2 and L2-L3 level.   Please call her, let her know that I reviewed her CT scan.  She has some lumbar disc disease which she should discuss with her primary care physician.  Ask how she is feeling since the medicine changes which we discussed about 2 weeks ago in the office.  Thanks

## 2019-10-12 NOTE — Telephone Encounter (Signed)
I spoke with the pt and she states she is about the same.  No worse - no better.

## 2019-10-13 ENCOUNTER — Other Ambulatory Visit: Payer: Self-pay

## 2019-10-13 MED ORDER — OMEPRAZOLE 40 MG PO CPDR: 40.0000 mg | DELAYED_RELEASE_CAPSULE | Freq: Two times a day (BID) | ORAL | 3 refills | Status: DC

## 2019-10-13 NOTE — Telephone Encounter (Signed)
The patient has been notified of this information and all questions answered.  New prescription sent and ROV scheduled for 6/7.

## 2019-10-13 NOTE — Telephone Encounter (Signed)
Left message on machine to call back  

## 2019-10-13 NOTE — Telephone Encounter (Signed)
Ok, lets increase her PPI to twice daily dosing.  Offer her a new prescription for 1 month at [facility] with 6 refills. ROV with me in 5-6 weeks to see how she is doing.  Thanks

## 2019-11-30 ENCOUNTER — Encounter: Payer: Self-pay | Admitting: Gastroenterology

## 2019-11-30 ENCOUNTER — Ambulatory Visit: Payer: BC Managed Care – PPO | Admitting: Gastroenterology

## 2019-11-30 VITALS — BP 140/84 | HR 80 | Ht 67.5 in | Wt 230.1 lb

## 2019-11-30 DIAGNOSIS — R1084 Generalized abdominal pain: Secondary | ICD-10-CM | POA: Diagnosis not present

## 2019-11-30 DIAGNOSIS — R1011 Right upper quadrant pain: Secondary | ICD-10-CM | POA: Diagnosis not present

## 2019-11-30 DIAGNOSIS — Z01818 Encounter for other preprocedural examination: Secondary | ICD-10-CM | POA: Diagnosis not present

## 2019-11-30 DIAGNOSIS — R194 Change in bowel habit: Secondary | ICD-10-CM | POA: Diagnosis not present

## 2019-11-30 MED ORDER — SUPREP BOWEL PREP KIT 17.5-3.13-1.6 GM/177ML PO SOLN: 1.0000 | ORAL | 0 refills | Status: DC

## 2019-11-30 NOTE — Progress Notes (Signed)
Review of pertinent gastrointestinal problems: 1.  GERD, dysphagia.  EGD November 2018 Dr. Ardis Hughs found severe, LA grade C, reflux related esophagitis and a small hiatal hernia.  I changed her from H2 blocker to omeprazole 40 mg once daily.  She was to call back in 2 to 3 months to report her symptoms.  We have not heard from her since 2.  Colonoscopy, November 2018 for routine risk screening found 8 mm tubular adenoma.  Diverticulosis of the left colon. 3.  Intermittent right upper quadrant pains.  Labs January 2021 showed normal complete metabolic profile, normal hemoglobin except MCV was 101.  CT scan abdomen pelvis with IV and oral contrast May 2021 showed no clear etiology.  HPI: This is a very pleasant 63 year old woman whomI last saw her about 2 months ago.  She was having some bloating abdominal pressure and some intermittent right flank pains.  I thought perhaps some of her symptoms might be prolonged GI effects of her Covid illness.  She had gained 20 pounds and I thought that could also probably contribute to her GI symptoms.  She was not taking proton pump inhibitor at the correct time on a daily basis and so I advised her to do that.  I also recommended she try Gas-X 1 pill every meal for her bloating which was a prominent symptom.  I recommended a CT scan to make sure we are not missing anything more serious.  She ended up getting this done at the [facility] system findings "no acute abnormalities in the abdomen or pelvis.  Hepatic steatosis.  Also spinal stenosis L1-L2 and L2-L3.  She has followed up with her neurosurgeon and actually had an MRI evaluation of her spine as well.  She really has not noticed much changes in her abdominal, GI issues.  She is still bothered by right upper quadrant, right flank pressure.  This can sometimes wake her at night, it has happened twice in the past 6 weeks.  This generally happens when she is laying on her left side.  The discomforts can be worse if  she eats a full 3 meals.  The discomforts can be worse sometimes around bowel movements as well.  She has also noticed that her bowels have really never normalized since her Covid infection June 2020.  Prior to then she was having a bowel movement about once a day.  Since then she is having 3-4 bowel movements per day with some minor urgency and some mucus production.  She does not see blood in her stool.   ROS: complete GI ROS as described in HPI, all other review negative.  Constitutional:  No unintentional weight loss   Past Medical History:  Diagnosis Date  . Abdominal pain, epigastric 09/23/2019  . Allergy   . Arthritis   . Blood transfusion without reported diagnosis    1982 during childbirth  . GERD (gastroesophageal reflux disease)   . 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 RUQ pain 09/23/2019  . Seronegative rheumatoid arthritis ([place]) 11/12/2012  . Sjogren's syndrome ([place]) 11/12/2012  . Spondylolisthesis at L4-L5 level 05/20/2019  . Thyroid disease     Past Surgical History:  Procedure Laterality Date  . ABDOMINAL HYSTERECTOMY    . BACK SURGERY    . CHOLECYSTECTOMY    . KNEE SURGERY    . TONSILECTOMY/ADENOIDECTOMY WITH MYRINGOTOMY      Current Outpatient Medications  Medication Sig Dispense Refill  . acetaminophen (TYLENOL) 500 MG tablet Take 500-1,000 mg by  mouth every 6 (six) hours as needed (For pain.).    Marland Kitchen Ascorbic Acid (VITAMIN C) 1000 MG tablet Take 1,000 mg by mouth daily.    . Carboxymethylcell-Hypromellose (GENTEAL OP) Place 2 drops into both eyes as needed (For dry eyes.).    Marland Kitchen Cholecalciferol (VITAMIN D3) 5000 units TABS Take 5,000 Units by mouth daily.     Marland Kitchen omega-3 acid ethyl esters (LOVAZA) 1 g capsule Take 2 g by mouth 2 (two) times daily.    Marland Kitchen omeprazole (PRILOSEC) 40 MG capsule Take 1 capsule (40 mg total) by mouth in the morning and at bedtime. 60 capsule 3  . PRESCRIPTION MEDICATION Apply 100 mg topically See admin instructions.  Progesterone 200mg /ml 20% Cream - applies at bedtime 3 weeks out of the month    . PRESCRIPTION MEDICATION Apply 1 mg topically See admin instructions. Testosterone 2mg /ml Cream - applies daily 3 weeks out of the month    . PRESCRIPTION MEDICATION Apply 0.75 mLs topically See admin instructions. Bi-Est 50:50 5mg /ml Cream - applies twice daily for 3 weeks out of the month    . Simethicone 125 MG CAPS Take 2 capsules by mouth 2 (two) times daily as needed.    . thyroid (NP THYROID) 120 MG tablet Take 120 mg by mouth daily before breakfast.     No current facility-administered medications for this visit.    Allergies as of 11/30/2019 - Review Complete 11/30/2019  Allergen Reaction Noted  . Sulfa antibiotics Swelling and Rash 08/18/2011    Family History  Problem Relation Age of Onset  . Thyroid disease Mother   . Cervical cancer Mother   . Breast cancer Sister   . Diabetes Sister     Social History   Socioeconomic History  . Marital status: Married    Spouse name: Not on file  . Number of children: 4  . Years of education: Not on file  . Highest education level: Not on file  Occupational History  . Not on file  Tobacco Use  . Smoking status: Never Smoker  . Smokeless tobacco: Never Used  Substance and Sexual Activity  . Alcohol use: Yes    Comment: occasional   . Drug use: No  . Sexual activity: Not on file  Other Topics Concern  . Not on file  Social History Narrative  . Not on file   Social Determinants of Health   Financial Resource Strain:   . Difficulty of Paying Living Expenses:   Food Insecurity:   . Worried About Charity fundraiser in the Last Year:   . Arboriculturist in the Last Year:   Transportation Needs:   . Film/video editor (Medical):   Marland Kitchen Lack of Transportation (Non-Medical):   Physical Activity:   . Days of Exercise per Week:   . Minutes of Exercise per Session:   Stress:   . Feeling of Stress :   Social Connections:   . Frequency of  Communication with Friends and Family:   . Frequency of Social Gatherings with Friends and Family:   . Attends Religious Services:   . Active Member of Clubs or Organizations:   . Attends Archivist Meetings:   Marland Kitchen Marital Status:   Intimate Partner Violence:   . Fear of Current or Ex-Partner:   . Emotionally Abused:   Marland Kitchen Physically Abused:   . Sexually Abused:      Physical Exam: BP 140/84 (BP Location: Left Arm, Patient Position: Sitting, Cuff Size:  Normal)   Pulse 80   Ht 5' 7.5" (1.715 m) Comment: height measured without shoes  Wt 230 lb 2 oz (104.4 kg)   BMI 35.51 kg/m  Constitutional: generally well-appearing Psychiatric: alert and oriented x3 Abdomen: soft, nontender, nondistended, no obvious ascites, no peritoneal signs, normal bowel sounds No peripheral edema noted in lower extremities  Assessment and plan: 64 y.o. female with persistent right flank, right upper quadrant discomfort, postprandial in nature, also altered bowel habits since Covid infection June 2020  Unclear etiology of her upper and lower GI symptoms.  CT scan abdomen pelvis was essentially normal and that is certainly encouraging at least that nothing serious is going on.  She is still quite bothered however and I explained that the next step in GI evaluation is colonoscopy and upper endoscopy.  I would plan to biopsy her stomach even if normal-appearing check for H. pylori.  I would also plan to biopsy her colon even if normal looking to check for microscopic colitis.  She understood and agrees to go ahead.  I see no reason for any further blood tests or imaging studies prior to then.  Please see the "Patient Instructions" section for addition details about the plan.  Owens Loffler, MD [facility] 11/30/2019, 8:53 AM   Total time on date of encounter was 30 minutes (this included time spent preparing to see the patient reviewing records; obtaining and/or reviewing separately obtained  history; performing a medically appropriate exam and/or evaluation; counseling and educating the patient and family if present; ordering medications, tests or procedures if applicable; and documenting clinical information in the health record).

## 2019-11-30 NOTE — Patient Instructions (Addendum)
If you are age 62 or older, your body mass index should be between 23-30. Your Body mass index is 35.51 kg/m. If this is out of the aforementioned range listed, please consider follow up with your Primary Care Provider.  If you are age 35 or younger, your body mass index should be between 19-25. Your Body mass index is 35.51 kg/m. If this is out of the aformentioned range listed, please consider follow up with your Primary Care Provider.   You have been scheduled for an endoscopy and colonoscopy. Please follow the written instructions given to you at your visit today. Please pick up your prep supplies at the pharmacy within the next 1-3 days. If you use inhalers (even only as needed), please bring them with you on the day of your procedure.  Due to recent changes in healthcare laws, you may see the results of your imaging and laboratory studies on MyChart before your provider has had a chance to review them.  We understand that in some cases there may be results that are confusing or concerning to you. Not all laboratory results come back in the same time frame and the provider may be waiting for multiple results in order to interpret others.  Please give [place] 48 hours in order for your provider to thoroughly review all the results before contacting the office for clarification of your results.   Thank you for entrusting me with your care and choosing [facility].  Dr Ardis Hughs

## 2019-12-14 ENCOUNTER — Ambulatory Visit (INDEPENDENT_AMBULATORY_CARE_PROVIDER_SITE_OTHER): Payer: BC Managed Care – PPO

## 2019-12-14 ENCOUNTER — Other Ambulatory Visit: Payer: Self-pay | Admitting: Gastroenterology

## 2019-12-14 DIAGNOSIS — Z1159 Encounter for screening for other viral diseases: Secondary | ICD-10-CM

## 2019-12-15 ENCOUNTER — Telehealth: Payer: Self-pay

## 2019-12-15 ENCOUNTER — Other Ambulatory Visit: Payer: Self-pay | Admitting: Gastroenterology

## 2019-12-15 LAB — SARS CORONAVIRUS 2 (TAT 6-24 HRS): SARS Coronavirus 2: INVALID

## 2019-12-15 NOTE — Telephone Encounter (Signed)
Called patient and let her know that we will proceed with her procedure tomorrow morning. Patient verbalized understanding.

## 2019-12-15 NOTE — Telephone Encounter (Signed)
Please let the patient know that we are going to continue forward with plans for her colonoscopy and upper endoscopy tomorrow.  She is doing everything she can to comply with testing.  If we have to wear N95's because the result is not available by the time she shows up then so be it.  Thank you

## 2019-12-15 NOTE — Telephone Encounter (Signed)
Patient is scheduled to see Dr. Ardis Hughs on 12/15/19 at 32 for a EndoColon. Patient went to [facility] for her covid test on 12/14/19. [facility] notified [facility] that after running her covid test 3 times yesterday they were unable to get a result from the test. [facility] spoke with patient about test from yesterday and patient agreed to go back to get another test today with the hopes that they can get a result from sample provided today. Covid test was done today, but we will not receive results until tomorrow morning.

## 2019-12-16 ENCOUNTER — Ambulatory Visit (AMBULATORY_SURGERY_CENTER): Payer: BC Managed Care – PPO | Admitting: Gastroenterology

## 2019-12-16 ENCOUNTER — Encounter: Payer: Self-pay | Admitting: Gastroenterology

## 2019-12-16 ENCOUNTER — Other Ambulatory Visit: Payer: Self-pay

## 2019-12-16 VITALS — BP 120/56 | HR 62 | Temp 98.2°F | Resp 8 | Ht 67.0 in | Wt 230.0 lb

## 2019-12-16 DIAGNOSIS — R194 Change in bowel habit: Secondary | ICD-10-CM | POA: Diagnosis present

## 2019-12-16 DIAGNOSIS — K3189 Other diseases of stomach and duodenum: Secondary | ICD-10-CM | POA: Diagnosis not present

## 2019-12-16 DIAGNOSIS — K2970 Gastritis, unspecified, without bleeding: Secondary | ICD-10-CM | POA: Diagnosis not present

## 2019-12-16 DIAGNOSIS — K2950 Unspecified chronic gastritis without bleeding: Secondary | ICD-10-CM

## 2019-12-16 DIAGNOSIS — R1011 Right upper quadrant pain: Secondary | ICD-10-CM

## 2019-12-16 LAB — SARS CORONAVIRUS 2 (TAT 6-24 HRS): SARS Coronavirus 2: NEGATIVE

## 2019-12-16 MED ORDER — SODIUM CHLORIDE 0.9 % IV SOLN
500.0000 mL | Freq: Once | INTRAVENOUS | Status: DC
Start: 2019-12-16 — End: 2019-12-16

## 2019-12-16 NOTE — Progress Notes (Signed)
Pt's states no medical or surgical changes since previsit or office visit. 

## 2019-12-16 NOTE — Progress Notes (Signed)
To PACU, VSS. Report to Rn.tb VO for additional meds. 

## 2019-12-16 NOTE — Op Note (Signed)
[facility] Patient Name: Vanessa Velasquez Procedure Date: 12/16/2019 10:35 AM MRN: [id number] Endoscopist: Milus Banister , MD Age: 64 Referring MD:  Date of Birth: 05-23-1956 Gender: Female Account #: [id number] Procedure:                Colonoscopy Indications:              Screening for colorectal malignant neoplasm, Medicines:                Monitored Anesthesia Care Procedure:                Pre-Anesthesia Assessment:                           - Prior to the procedure, a History and Physical                            was performed, and patient medications and                            allergies were reviewed. The patient's tolerance of                            previous anesthesia was also reviewed. The risks                            and benefits of the procedure and the sedation                            options and risks were discussed with the patient.                            All questions were answered, and informed consent                            was obtained. Prior Anticoagulants: The patient has                            taken no previous anticoagulant or antiplatelet                            agents. ASA Grade Assessment: II - A patient with                            mild systemic disease. After reviewing the risks                            and benefits, the patient was deemed in                            satisfactory condition to undergo the procedure.                           After obtaining informed consent, the colonoscope  was passed under direct vision. Throughout the                            procedure, the patient's blood pressure, pulse, and                            oxygen saturations were monitored continuously. The                            Colonoscope was introduced through the anus and                            advanced to the the terminal ileum. The colonoscopy                            was performed  without difficulty. The patient                            tolerated the procedure well. The quality of the                            bowel preparation was good. The terminal ileum,                            ileocecal valve, appendiceal orifice, and rectum                            were photographed. Scope In: 10:43:48 AM Scope Out: 10:50:31 AM Scope Withdrawal Time: 0 hours 4 minutes 41 seconds  Total Procedure Duration: 0 hours 6 minutes 43 seconds  Findings:                 The terminal ileum appeared normal.                           The entire examined colon appeared normal on direct                            and retroflexion views.                           Biopsies for histology were taken with a cold                            forceps from the entire colon for evaluation of                            microscopic colitis. Complications:            No immediate complications. Estimated blood loss:                            None. Estimated Blood Loss:     Estimated blood loss: none. Impression:               - The examined portion of the ileum was  normal.                           - The entire examined colon is normal on direct and                            retroflexion views.                           - Biopsies were taken with a cold forceps from the                            entire colon for evaluation of microscopic colitis. Recommendation:           - EGD now.                           - Await pathology results. Milus Banister, MD 12/16/2019 11:00:43 AM This report has been signed electronically.

## 2019-12-16 NOTE — Patient Instructions (Signed)
Resume previous diet  continue present medications Await pathology results   YOU HAD AN ENDOSCOPIC PROCEDURE TODAY AT [facility]:   Refer to the procedure report that was given to you for any specific questions about what was found during the examination.  If the procedure report does not answer your questions, please call your gastroenterologist to clarify.  If you requested that your care partner not be given the details of your procedure findings, then the procedure report has been included in a sealed envelope for you to review at your convenience later.  YOU SHOULD EXPECT: Some feelings of bloating in the abdomen. Passage of more gas than usual.  Walking can help get rid of the air that was put into your GI tract during the procedure and reduce the bloating. If you had a lower endoscopy (such as a colonoscopy or flexible sigmoidoscopy) you may notice spotting of blood in your stool or on the toilet paper. If you underwent a bowel prep for your procedure, you may not have a normal bowel movement for a few days.  Please Note:  You might notice some irritation and congestion in your nose or some drainage.  This is from the oxygen used during your procedure.  There is no need for concern and it should clear up in a day or so.  SYMPTOMS TO REPORT IMMEDIATELY:   Following lower endoscopy (colonoscopy or flexible sigmoidoscopy):  Excessive amounts of blood in the stool  Significant tenderness or worsening of abdominal pains  Swelling of the abdomen that is new, acute  Fever of 100F or higher   Following upper endoscopy (EGD)  Vomiting of blood or coffee ground material  New chest pain or pain under the shoulder blades  Painful or persistently difficult swallowing  New shortness of breath  Fever of 100F or higher  Black, tarry-looking stools  For urgent or emergent issues, a gastroenterologist can be reached at any hour by calling [phone]. Do not use MyChart  messaging for urgent concerns.    DIET:  We do recommend a small meal at first, but then you may proceed to your regular diet.  Drink plenty of fluids but you should avoid alcoholic beverages for 24 hours.  ACTIVITY:  You should plan to take it easy for the rest of today and you should NOT DRIVE or use heavy machinery until tomorrow (because of the sedation medicines used during the test).    FOLLOW UP: Our staff will call the number listed on your records 48-72 hours following your procedure to check on you and address any questions or concerns that you may have regarding the information given to you following your procedure. If we do not reach you, we will leave a message.  We will attempt to reach you two times.  During this call, we will ask if you have developed any symptoms of COVID 19. If you develop any symptoms (ie: fever, flu-like symptoms, shortness of breath, cough etc.) before then, please call [phone].  If you test positive for Covid 19 in the 2 weeks post procedure, please call and report this information to [place].    If any biopsies were taken you will be contacted by phone or by letter within the next 1-3 weeks.  Please call us at [phone] if you have not heard about the biopsies in 3 weeks.    SIGNATURES/CONFIDENTIALITY: You and/or your care partner have signed paperwork which will be entered into your electronic medical record.  These signatures attest to the fact that that the information above on your After Visit Summary has been reviewed and is understood.  Full responsibility of the confidentiality of this discharge information lies with you and/or your care-partner.

## 2019-12-16 NOTE — Op Note (Signed)
[facility] Patient Name: Vanessa Velasquez Procedure Date: 12/16/2019 10:35 AM MRN: [id number] Endoscopist: Milus Banister , MD Age: 64 Referring MD:  Date of Birth: 05/31/1956 Gender: Female Account #: [id number] Procedure:                Upper GI endoscopy Indications:              Abdominal pain in the right upper quadrant Medicines:                Monitored Anesthesia Care Procedure:                Pre-Anesthesia Assessment:                           - Prior to the procedure, a History and Physical                            was performed, and patient medications and                            allergies were reviewed. The patient's tolerance of                            previous anesthesia was also reviewed. The risks                            and benefits of the procedure and the sedation                            options and risks were discussed with the patient.                            All questions were answered, and informed consent                            was obtained. Prior Anticoagulants: The patient has                            taken no previous anticoagulant or antiplatelet                            agents. ASA Grade Assessment: II - A patient with                            mild systemic disease. After reviewing the risks                            and benefits, the patient was deemed in                            satisfactory condition to undergo the procedure.                           After obtaining informed consent, the endoscope was  passed under direct vision. Throughout the                            procedure, the patient's blood pressure, pulse, and                            oxygen saturations were monitored continuously. The                            Endoscope was introduced through the mouth, and                            advanced to the second part of duodenum. The upper                            GI endoscopy  was accomplished without difficulty.                            The patient tolerated the procedure well. Scope In: Scope Out: Findings:                 Minimal inflammation characterized by erythema was                            found in the gastric antrum. Biopsies were taken                            with a cold forceps for histology.                           The exam was otherwise without abnormality. Complications:            No immediate complications. Estimated blood loss:                            None. Estimated Blood Loss:     Estimated blood loss: none. Impression:               - Very mild gastritis, biopsied to check for H.                            pylori.                           - The examination was otherwise normal. Recommendation:           - Patient has a contact number available for                            emergencies. The signs and symptoms of potential                            delayed complications were discussed with the                            patient. Return to normal activities tomorrow.  Written discharge instructions were provided to the                            patient.                           - Resume previous diet.                           - Continue present medications.                           - Await pathology results. Milus Banister, MD 12/16/2019 11:02:29 AM This report has been signed electronically.

## 2019-12-18 ENCOUNTER — Telehealth: Payer: Self-pay

## 2019-12-18 NOTE — Telephone Encounter (Signed)
Covid-19 screening questions   Do you now or have you had a fever in the last 14 days? No.  Do you have any respiratory symptoms of shortness of breath or cough now or in the last 14 days? No.  Do you have any family members or close contacts with diagnosed or suspected Covid-19 in the past 14 days? No.  Have you been tested for Covid-19 and found to be positive? No.      Follow up Call-  Call back number 12/16/2019 05/10/2017  Post procedure Call Back phone  # [phone]  Permission to leave phone message Yes Yes  Some recent data might be hidden     Patient questions:  Do you have a fever, pain , or abdominal swelling? No. Pain Score  0 *  Have you tolerated food without any problems? Yes.    Have you been able to return to your normal activities? Yes.    Do you have any questions about your discharge instructions: Diet   No. Medications  No. Follow up visit  No.  Do you have questions or concerns about your Care? No.  Actions: * If pain score is 4 or above: No action needed, pain <4.

## 2019-12-18 NOTE — Telephone Encounter (Signed)
First attempt follow up call to pt, lm on vm 

## 2020-01-04 ENCOUNTER — Other Ambulatory Visit: Payer: Self-pay

## 2020-01-04 MED ORDER — OMEPRAZOLE 40 MG PO CPDR: 40.0000 mg | DELAYED_RELEASE_CAPSULE | Freq: Two times a day (BID) | ORAL | 11 refills | Status: DC

## 2020-02-15 ENCOUNTER — Ambulatory Visit: Payer: BLUE CROSS/BLUE SHIELD | Admitting: Gastroenterology

## 2020-02-15 ENCOUNTER — Encounter: Payer: Self-pay | Admitting: Gastroenterology

## 2020-02-15 VITALS — BP 118/78 | HR 75 | Ht 68.0 in | Wt 229.0 lb

## 2020-02-15 DIAGNOSIS — R1011 Right upper quadrant pain: Secondary | ICD-10-CM | POA: Diagnosis not present

## 2020-02-15 NOTE — Progress Notes (Signed)
Review of pertinent gastrointestinal problems: 1.GERD, dysphagia. EGD November 2018 Dr. Ardis Hughs found severe, LA grade C, reflux related esophagitis and a small hiatal hernia. I changed her from H2 blocker to omeprazole 40 mg once daily. She was to call back in 2 to 3 months to report her symptoms. We have not heard from her since 2.Colonoscopy, November 2018 for routine risk screening found 8 mm tubular adenoma. Diverticulosis of the left colon. 3.  Intermittent right upper quadrant pains.  Labs January 2021 showed normal complete metabolic profile, normal hemoglobin except MCV was 101.  CT scan abdomen pelvis with IV and oral contrast May 2021 showed no clear etiology. 4.  Covid infection June 2020.  Persistent right flank, right upper quadrant, postprandial discomfort and altered bowel habits since the infection.  Extensive work-up including CT scan 10/2019.  EGD June 2021, minimal gastritis, pathology showed no H. Pylori.  Colonoscopy June 2021 normal terminal ileum, normal colon mucosa.  Random colon biopsies were all normal.  HPI: This is a very pleasant 64 year old woman whom I last saw the time of colonoscopy and upper endoscopy about 3 months ago.  See those results summarized above  Right flank, RUQ pains.  Worse after her EGD for 4 days.  Intermittently severe since then.  Pressure like sensation.  Laying on left makes it worse.  Almost hard to breath.  These are often postprandial.  Occurs about daily but most of those are fairly low level discomforts.  She has had a least a couple episodes of severe pain in the interim.  Imodium helps, almost too strong.  1/2 tab not enough but 1 is too much.  Soft to water.  Has abd with cardiology this week.   ROS: complete GI ROS as described in HPI, all other review negative.  Constitutional:  No unintentional weight loss   Past Medical History:  Diagnosis Date  . Abdominal pain, epigastric 09/23/2019  . Allergy   . Arthritis   .  Blood transfusion without reported diagnosis    1982 during childbirth  . COVID-19   . GERD (gastroesophageal reflux disease)   . 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 RUQ pain 09/23/2019  . Seronegative rheumatoid arthritis ([place]) 11/12/2012  . Sjogren's syndrome ([place]) 11/12/2012  . Spondylolisthesis at L4-L5 level 05/20/2019  . Thyroid disease     Past Surgical History:  Procedure Laterality Date  . ABDOMINAL HYSTERECTOMY    . BACK SURGERY    . CHOLECYSTECTOMY    . KNEE SURGERY    . TONSILECTOMY/ADENOIDECTOMY WITH MYRINGOTOMY      Current Outpatient Medications  Medication Sig Dispense Refill  . acetaminophen (TYLENOL) 500 MG tablet Take 500-1,000 mg by mouth every 6 (six) hours as needed (For pain.).    . Carboxymethylcell-Hypromellose (GENTEAL OP) Place 2 drops into both eyes as needed (For dry eyes.).    Marland Kitchen Cholecalciferol (VITAMIN D3) 5000 units TABS Take 5,000 Units by mouth daily.     Marland Kitchen loperamide (IMODIUM A-D) 2 MG tablet Take 2 mg by mouth 4 (four) times daily as needed for diarrhea or loose stools. Taking once daily    . omega-3 acid ethyl esters (LOVAZA) 1 g capsule Take 2 g by mouth 2 (two) times daily.    Marland Kitchen omeprazole (PRILOSEC) 40 MG capsule Take 1 capsule (40 mg total) by mouth in the morning and at bedtime. 60 capsule 11  . PRESCRIPTION MEDICATION Apply 100 mg topically See admin instructions. Progesterone 200mg /ml 20% Cream - applies at  bedtime 3 weeks out of the month    . PRESCRIPTION MEDICATION Apply 1 mg topically See admin instructions. Testosterone 2mg /ml Cream - applies daily 3 weeks out of the month    . PRESCRIPTION MEDICATION Apply 0.75 mLs topically See admin instructions. Bi-Est 50:50 5mg /ml Cream - applies twice daily for 3 weeks out of the month    . Simethicone 125 MG CAPS Take 2 capsules by mouth 2 (two) times daily as needed.    . thyroid (NP THYROID) 120 MG tablet Take 90 mg by mouth daily before breakfast.      No current  facility-administered medications for this visit.    Allergies as of 02/15/2020 - Review Complete 02/15/2020  Allergen Reaction Noted  . Sulfa antibiotics Swelling and Rash 08/18/2011    Family History  Problem Relation Age of Onset  . Thyroid disease Mother   . Cervical cancer Mother   . Breast cancer Sister   . Diabetes Sister     Social History   Socioeconomic History  . Marital status: Married    Spouse name: Not on file  . Number of children: 4  . Years of education: Not on file  . Highest education level: Not on file  Occupational History  . Not on file  Tobacco Use  . Smoking status: Never Smoker  . Smokeless tobacco: Never Used  Vaping Use  . Vaping Use: Never used  Substance and Sexual Activity  . Alcohol use: Yes    Comment: occasional   . Drug use: No  . Sexual activity: Not on file  Other Topics Concern  . Not on file  Social History Narrative  . Not on file   Social Determinants of Health   Financial Resource Strain:   . Difficulty of Paying Living Expenses: Not on file  Food Insecurity:   . Worried About Charity fundraiser in the Last Year: Not on file  . Ran Out of Food in the Last Year: Not on file  Transportation Needs:   . Lack of Transportation (Medical): Not on file  . Lack of Transportation (Non-Medical): Not on file  Physical Activity:   . Days of Exercise per Week: Not on file  . Minutes of Exercise per Session: Not on file  Stress:   . Feeling of Stress : Not on file  Social Connections:   . Frequency of Communication with Friends and Family: Not on file  . Frequency of Social Gatherings with Friends and Family: Not on file  . Attends Religious Services: Not on file  . Active Member of Clubs or Organizations: Not on file  . Attends Archivist Meetings: Not on file  . Marital Status: Not on file  Intimate Partner Violence:   . Fear of Current or Ex-Partner: Not on file  . Emotionally Abused: Not on file  . Physically  Abused: Not on file  . Sexually Abused: Not on file     Physical Exam: BP 118/78   Pulse 75   Ht 5\' 8"  (1.727 m)   Wt 229 lb (103.9 kg)   BMI 34.82 kg/m  Constitutional: generally well-appearing Psychiatric: alert and oriented x3 Abdomen: soft, nontender, nondistended, no obvious ascites, no peritoneal signs, normal bowel sounds No peripheral edema noted in lower extremities  Assessment and plan: 64 y.o. female with persistent intermittent right upper quadrant pains, loose stools  First for her chronic loose stools, she has had a full colonoscopy with random biopsies have a look in the  terminal ileum.  Those were all normal.  Fortunately Imodium helps quite well in fact sometimes it is too strong.  I encouraged her to titrate the dose up and down as she needs and I do not think further work-up needs to be entertained for her chronic loose stools.  She has right-sided abdominal pains, right upper quadrant, sometimes right upper back.  These are often postprandial, they are often positional.  I do think there is a chance these are adhesive related.  We also discussed possibly postcholecystectomy retained bile duct stone.  She recalls she never had gallstones in the first place and so certainly that makes it less likely that she would have stones now but probably not unheard of.  Her liver tests have been persistently normal and so that also lowers the odds.  I recommended that if she has severe right-sided abdominal pain to she should call my office and we will work to get stat labs checked that day which will include a CBC and complete metabolic profile.  She understands that if her liver test transiently bump during these episodes of right-sided pains that it it may be an indication of biliary pathology.  Please see the "Patient Instructions" section for addition details about the plan.  Owens Loffler, MD [facility] 02/15/2020, 11:29 AM   Total time on date of encounter  was 30 minutes (this included time spent preparing to see the patient reviewing records; obtaining and/or reviewing separately obtained history; performing a medically appropriate exam and/or evaluation; counseling and educating the patient and family if present; ordering medications, tests or procedures if applicable; and documenting clinical information in the health record).

## 2020-02-15 NOTE — Patient Instructions (Addendum)
If you are age 62 or older, your body mass index should be between 23-30. Your Body mass index is 34.82 kg/m. If this is out of the aforementioned range listed, please consider follow up with your Primary Care Provider.  If you are age 95 or younger, your body mass index should be between 19-25. Your Body mass index is 34.82 kg/m. If this is out of the aformentioned range listed, please consider follow up with your Primary Care Provider.   Please call our office when you have significant abdominal pain.  At that time you will come into the office for STAT: CBC and CMET. You will go to the basement level for lab work. Press "B" on the elevator. The lab is located at the first door on the left as you exit the elevator.   Thank you for entrusting me with your care and choosing [facility].  Dr Ardis Hughs

## 2020-02-17 ENCOUNTER — Other Ambulatory Visit: Payer: Self-pay

## 2020-02-17 MED ORDER — OMEPRAZOLE 40 MG PO CPDR: 40.0000 mg | DELAYED_RELEASE_CAPSULE | Freq: Two times a day (BID) | ORAL | 11 refills | Status: DC

## 2020-02-23 ENCOUNTER — Other Ambulatory Visit: Payer: Self-pay | Admitting: Urology

## 2020-02-23 DIAGNOSIS — R109 Unspecified abdominal pain: Secondary | ICD-10-CM

## 2020-03-03 ENCOUNTER — Encounter (HOSPITAL_COMMUNITY): Payer: BLUE CROSS/BLUE SHIELD

## 2020-03-04 ENCOUNTER — Ambulatory Visit (HOSPITAL_COMMUNITY): Payer: BLUE CROSS/BLUE SHIELD

## 2020-03-07 ENCOUNTER — Other Ambulatory Visit: Payer: Self-pay

## 2020-03-07 ENCOUNTER — Encounter (HOSPITAL_COMMUNITY)
Admission: RE | Admit: 2020-03-07 | Discharge: 2020-03-07 | Disposition: A | Discharge status: Home / Self Care | Payer: BLUE CROSS/BLUE SHIELD | Source: Ambulatory Visit | Attending: Urology | Admitting: Urology

## 2020-03-07 DIAGNOSIS — R109 Unspecified abdominal pain: Secondary | ICD-10-CM | POA: Diagnosis not present

## 2020-03-07 MED ORDER — FUROSEMIDE 10 MG/ML IJ SOLN
INTRAMUSCULAR | Status: AC
  Filled 2020-03-07: qty 8

## 2020-03-07 MED ORDER — TECHNETIUM TC 99M MERTIATIDE
5.5000 | Freq: Once | INTRAVENOUS | Status: AC | PRN
  Administered 2020-03-07: 5.5 via INTRAVENOUS

## 2020-03-07 MED ORDER — FUROSEMIDE 10 MG/ML IJ SOLN
52.0000 mg | Freq: Once | INTRAMUSCULAR | Status: AC
  Administered 2020-03-07: 52 mg via INTRAVENOUS

## 2020-04-08 ENCOUNTER — Telehealth: Payer: Self-pay | Admitting: Gastroenterology

## 2020-04-08 NOTE — Telephone Encounter (Signed)
Recv'd records from [place] forwarded 4 pages to Dr. Oran Rein 10/15/21fbg

## 2020-04-12 ENCOUNTER — Telehealth: Payer: Self-pay | Admitting: Gastroenterology

## 2020-04-12 NOTE — Telephone Encounter (Signed)
The pt has been advised and understanding verbalized

## 2020-04-12 NOTE — Telephone Encounter (Signed)
I reviewed a packet of labs that were drawn April 04, 2020 at [facility] in [place].  Lipase was normal, GGT was normal, complete metabolic profile was normal except for glucose 100.  Her LFTs were all completely normal.  Her CBC was essentially normal except for slightly low RDW.   I am unaware of the context of why these labs were drawn.  Can you please let her know that I reviewed them however and they all look normal to me.

## 2021-06-01 ENCOUNTER — Other Ambulatory Visit: Payer: Self-pay | Admitting: Gastroenterology

## 2021-08-19 IMAGING — NM NM RENAL IMAGING FLOW W/ PHARM
2 series · 12 of 12 positions shown · non-contrast
Comparison: CT abdomen and pelvis 10/08/2019

CLINICAL DATA: RIGHT flank pain for 1 year, history kidney stones

EXAM:
NUCLEAR MEDICINE RENAL SCAN WITH DIURETIC ADMINISTRATION
TECHNIQUE: Radionuclide angiographic and sequential renal images were obtained
after intravenous injection of radiopharmaceutical. Imaging was
continued during slow intravenous injection of Lasix approximately
15 minutes after the start of the examination.
RADIOPHARMACEUTICALS:  5.5 mCi 6echnetium-22m MAG3 IV
Pharmaceutical: Lasix 52 mg IV 20 minutes into exam

[Series 1: renal scan · 4.14mm/px · 6 of 40 frames shown (1 of 2)]
[frame 4/40  full-range]
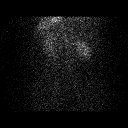
[frame 10/40  full-range]
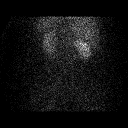
[frame 17/40  full-range]
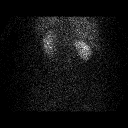
[frame 24/40  full-range]
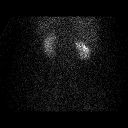
[frame 30/40  full-range]
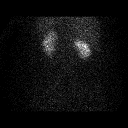
[frame 37/40  full-range]
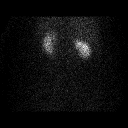

[Series 1: renal scan · 4.14mm/px · 6 of 81 frames shown (2 of 2)]
[frame 7/81]
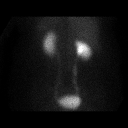
[frame 21/81]
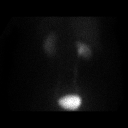
[frame 34/81]
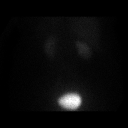
[frame 48/81]
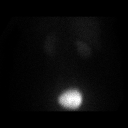
[frame 61/81]
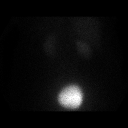
[frame 75/81]
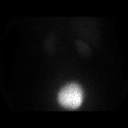

[12 of 12 positions shown; findings below may reference images not displayed]

FINDINGS: Flow:  Prompt symmetric arterial flow to the kidneys.

Left renogram: Normal uptake, concentration and excretion of tracer
by LEFT kidney. Good clearance of tracer before and continuing
following Lasix administration. No retained tracer at conclusion of
exam. Analysis of renogram curve demonstrates a normal time to the
peak activity of 2.7 minutes with fall to half maximum activity
minutes later.

Right renogram: Normal uptake, concentration and excretion of tracer
by RIGHT kidney. Good clearance of tracer before and continuing
following Lasix administration. No significant retained tracer at
conclusion of exam. Analysis of renogram curve demonstrates a normal
time to peak activity of 3.7 minutes with fall to half maximum
activity 8.2 minutes later

Differential:

Left kidney = 44 %

Right kidney = 56 %

T1/2 post Lasix :

Left kidney = 9.0 min

Right kidney = 8.9 min
IMPRESSION: Normal exam.
